# Patient Record
Sex: Female | Born: 1945
Health system: Southern US, Community
[De-identification: ages and names within clinical notes are randomized; demographics above are authoritative.]

## PROBLEM LIST (undated history)

## (undated) DIAGNOSIS — F419 Anxiety disorder, unspecified: Secondary | ICD-10-CM

## (undated) DIAGNOSIS — F329 Major depressive disorder, single episode, unspecified: Secondary | ICD-10-CM

## (undated) DIAGNOSIS — E785 Hyperlipidemia, unspecified: Secondary | ICD-10-CM

## (undated) DIAGNOSIS — H269 Unspecified cataract: Secondary | ICD-10-CM

## (undated) DIAGNOSIS — C801 Malignant (primary) neoplasm, unspecified: Secondary | ICD-10-CM

## (undated) DIAGNOSIS — R011 Cardiac murmur, unspecified: Secondary | ICD-10-CM

## (undated) DIAGNOSIS — K5730 Diverticulosis of large intestine without perforation or abscess without bleeding: Secondary | ICD-10-CM

## (undated) DIAGNOSIS — T7840XA Allergy, unspecified, initial encounter: Secondary | ICD-10-CM

## (undated) DIAGNOSIS — K635 Polyp of colon: Secondary | ICD-10-CM

## (undated) DIAGNOSIS — Z85828 Personal history of other malignant neoplasm of skin: Secondary | ICD-10-CM

## (undated) DIAGNOSIS — M1990 Unspecified osteoarthritis, unspecified site: Secondary | ICD-10-CM

## (undated) DIAGNOSIS — D039 Melanoma in situ, unspecified: Secondary | ICD-10-CM

## (undated) DIAGNOSIS — K219 Gastro-esophageal reflux disease without esophagitis: Secondary | ICD-10-CM

## (undated) DIAGNOSIS — J984 Other disorders of lung: Secondary | ICD-10-CM

## (undated) DIAGNOSIS — M8580 Other specified disorders of bone density and structure, unspecified site: Secondary | ICD-10-CM

## (undated) DIAGNOSIS — N951 Menopausal and female climacteric states: Secondary | ICD-10-CM

## (undated) DIAGNOSIS — R918 Other nonspecific abnormal finding of lung field: Secondary | ICD-10-CM

## (undated) DIAGNOSIS — D235 Other benign neoplasm of skin of trunk: Secondary | ICD-10-CM

## (undated) HISTORY — DX: Gastro-esophageal reflux disease without esophagitis: K21.9

## (undated) HISTORY — DX: Unspecified cataract: H26.9

## (undated) HISTORY — DX: Anxiety disorder, unspecified: F41.9

## (undated) HISTORY — PX: TONSILLECTOMY: SHX5217

## (undated) HISTORY — DX: Other benign neoplasm of skin of trunk: D23.5

## (undated) HISTORY — DX: Menopausal and female climacteric states: N95.1

## (undated) HISTORY — DX: Allergy, unspecified, initial encounter: T78.40XA

## (undated) HISTORY — DX: Melanoma in situ, unspecified: D03.9

## (undated) HISTORY — DX: Diverticulosis of large intestine without perforation or abscess without bleeding: K57.30

## (undated) HISTORY — DX: Polyp of colon: K63.5

## (undated) HISTORY — DX: Major depressive disorder, single episode, unspecified: F32.9

## (undated) HISTORY — DX: Cardiac murmur, unspecified: R01.1

## (undated) HISTORY — PX: COLONOSCOPY: SHX174

## (undated) HISTORY — DX: Malignant (primary) neoplasm, unspecified: C80.1

## (undated) HISTORY — DX: Other disorders of lung: J98.4

## (undated) HISTORY — DX: Other specified disorders of bone density and structure, unspecified site: M85.80

## (undated) HISTORY — DX: Personal history of other malignant neoplasm of skin: Z85.828

## (undated) HISTORY — DX: Hyperlipidemia, unspecified: E78.5

## (undated) HISTORY — PX: BUNIONECTOMY: SHX129

## (undated) HISTORY — DX: Unspecified osteoarthritis, unspecified site: M19.90

## (undated) HISTORY — DX: Other nonspecific abnormal finding of lung field: R91.8

---

## 1998-12-22 ENCOUNTER — Other Ambulatory Visit: Admission: RE | Admit: 1998-12-22 | Discharge: 1998-12-22 | Payer: Self-pay | Admitting: *Deleted

## 1999-06-10 ENCOUNTER — Other Ambulatory Visit: Admission: RE | Admit: 1999-06-10 | Discharge: 1999-06-10 | Payer: Self-pay | Admitting: *Deleted

## 1999-06-10 ENCOUNTER — Encounter (INDEPENDENT_AMBULATORY_CARE_PROVIDER_SITE_OTHER): Payer: Self-pay | Admitting: Specialist

## 1999-12-27 ENCOUNTER — Other Ambulatory Visit: Admission: RE | Admit: 1999-12-27 | Discharge: 1999-12-27 | Payer: Self-pay | Admitting: *Deleted

## 2000-06-05 HISTORY — PX: DILATION AND CURETTAGE OF UTERUS: SHX78

## 2001-01-07 ENCOUNTER — Other Ambulatory Visit: Admission: RE | Admit: 2001-01-07 | Discharge: 2001-01-07 | Payer: Self-pay | Admitting: *Deleted

## 2002-01-16 ENCOUNTER — Other Ambulatory Visit: Admission: RE | Admit: 2002-01-16 | Discharge: 2002-01-16 | Payer: Self-pay | Admitting: *Deleted

## 2002-10-14 LAB — HM COLONOSCOPY: HM Colonoscopy: NORMAL

## 2003-02-17 ENCOUNTER — Other Ambulatory Visit: Admission: RE | Admit: 2003-02-17 | Discharge: 2003-02-17 | Payer: Self-pay | Admitting: *Deleted

## 2004-04-13 ENCOUNTER — Other Ambulatory Visit: Admission: RE | Admit: 2004-04-13 | Discharge: 2004-04-13 | Payer: Self-pay | Admitting: *Deleted

## 2005-04-24 ENCOUNTER — Other Ambulatory Visit: Admission: RE | Admit: 2005-04-24 | Discharge: 2005-04-24 | Payer: Self-pay | Admitting: *Deleted

## 2006-04-11 ENCOUNTER — Other Ambulatory Visit: Admission: RE | Admit: 2006-04-11 | Discharge: 2006-04-11 | Payer: Self-pay | Admitting: *Deleted

## 2007-02-04 DIAGNOSIS — D039 Melanoma in situ, unspecified: Secondary | ICD-10-CM

## 2007-02-04 DIAGNOSIS — R918 Other nonspecific abnormal finding of lung field: Secondary | ICD-10-CM

## 2007-02-04 HISTORY — DX: Other nonspecific abnormal finding of lung field: R91.8

## 2007-02-04 HISTORY — DX: Melanoma in situ, unspecified: D03.9

## 2007-02-20 ENCOUNTER — Ambulatory Visit (HOSPITAL_COMMUNITY): Admission: RE | Admit: 2007-02-20 | Discharge: 2007-02-20 | Payer: Self-pay | Admitting: Dermatology

## 2007-03-25 ENCOUNTER — Ambulatory Visit: Payer: Self-pay | Admitting: Internal Medicine

## 2007-03-25 DIAGNOSIS — J984 Other disorders of lung: Secondary | ICD-10-CM

## 2007-03-25 DIAGNOSIS — N951 Menopausal and female climacteric states: Secondary | ICD-10-CM

## 2007-03-25 HISTORY — DX: Other disorders of lung: J98.4

## 2007-03-25 HISTORY — DX: Menopausal and female climacteric states: N95.1

## 2007-03-26 ENCOUNTER — Encounter: Payer: Self-pay | Admitting: Internal Medicine

## 2007-03-26 DIAGNOSIS — Z85828 Personal history of other malignant neoplasm of skin: Secondary | ICD-10-CM

## 2007-03-26 DIAGNOSIS — K5730 Diverticulosis of large intestine without perforation or abscess without bleeding: Secondary | ICD-10-CM

## 2007-03-26 HISTORY — DX: Personal history of other malignant neoplasm of skin: Z85.828

## 2007-03-26 HISTORY — DX: Diverticulosis of large intestine without perforation or abscess without bleeding: K57.30

## 2007-04-04 ENCOUNTER — Ambulatory Visit: Payer: Self-pay | Admitting: Internal Medicine

## 2007-04-04 LAB — CONVERTED CEMR LAB
ALT: 21 units/L (ref 0–35)
AST: 25 units/L (ref 0–37)
Alkaline Phosphatase: 47 units/L (ref 39–117)
BUN: 11 mg/dL (ref 6–23)
Basophils Relative: 0.4 % (ref 0.0–1.0)
Bilirubin, Direct: 0.1 mg/dL (ref 0.0–0.3)
CO2: 29 meq/L (ref 19–32)
Calcium: 10 mg/dL (ref 8.4–10.5)
Chloride: 106 meq/L (ref 96–112)
Creatinine, Ser: 0.7 mg/dL (ref 0.4–1.2)
Crystals: NEGATIVE
Eosinophils Absolute: 0.1 10*3/uL (ref 0.0–0.6)
Eosinophils Relative: 1.6 % (ref 0.0–5.0)
Glucose, Bld: 89 mg/dL (ref 70–99)
HCT: 42.4 % (ref 36.0–46.0)
Ketones, ur: NEGATIVE mg/dL
Lymphocytes Relative: 30.8 % (ref 12.0–46.0)
MCV: 92 fL (ref 78.0–100.0)
Mucus, UA: NEGATIVE
Neutro Abs: 3.9 10*3/uL (ref 1.4–7.7)
Neutrophils Relative %: 61.6 % (ref 43.0–77.0)
Total Bilirubin: 0.7 mg/dL (ref 0.3–1.2)
Total Protein, Urine: NEGATIVE mg/dL
Total Protein: 7.5 g/dL (ref 6.0–8.3)
VLDL: 25 mg/dL (ref 0–40)
Vit D, 1,25-Dihydroxy: 28 — ABNORMAL LOW (ref 30–89)
WBC: 6.3 10*3/uL (ref 4.5–10.5)

## 2007-04-30 ENCOUNTER — Other Ambulatory Visit: Admission: RE | Admit: 2007-04-30 | Discharge: 2007-04-30 | Payer: Self-pay | Admitting: *Deleted

## 2007-05-23 ENCOUNTER — Telehealth: Payer: Self-pay | Admitting: Internal Medicine

## 2008-03-16 ENCOUNTER — Encounter: Payer: Self-pay | Admitting: Internal Medicine

## 2008-04-27 ENCOUNTER — Encounter: Payer: Self-pay | Admitting: Internal Medicine

## 2008-05-01 ENCOUNTER — Encounter: Payer: Self-pay | Admitting: Internal Medicine

## 2008-05-04 ENCOUNTER — Other Ambulatory Visit: Admission: RE | Admit: 2008-05-04 | Discharge: 2008-05-04 | Payer: Self-pay | Admitting: Gynecology

## 2009-03-08 ENCOUNTER — Encounter: Payer: Self-pay | Admitting: Internal Medicine

## 2009-03-24 ENCOUNTER — Encounter: Payer: Self-pay | Admitting: Internal Medicine

## 2009-07-01 ENCOUNTER — Encounter: Payer: Self-pay | Admitting: Internal Medicine

## 2009-07-02 ENCOUNTER — Encounter: Payer: Self-pay | Admitting: Internal Medicine

## 2009-07-06 ENCOUNTER — Encounter: Payer: Self-pay | Admitting: Internal Medicine

## 2010-01-10 ENCOUNTER — Encounter: Payer: Self-pay | Admitting: Internal Medicine

## 2010-04-04 ENCOUNTER — Encounter: Payer: Self-pay | Admitting: Internal Medicine

## 2010-07-05 NOTE — Miscellaneous (Signed)
Summary: Mammogram  Clinical Lists Changes  Observations: Added new observation of MAMMOGRAM: normal (07/01/2009 8:45)      Preventive Care Screening  Mammogram:    Date:  07/01/2009    Results:  normal

## 2010-07-12 ENCOUNTER — Encounter: Payer: Self-pay | Admitting: Internal Medicine

## 2010-07-21 NOTE — Miscellaneous (Signed)
Summary: mammogram  Clinical Lists Changes  Observations: Added new observation of MAMMOGRAM: normal (07/12/2010 14:58)      Preventive Care Screening  Mammogram:    Date:  07/12/2010    Results:  normal

## 2010-09-14 ENCOUNTER — Encounter: Payer: Self-pay | Admitting: Family

## 2010-09-14 ENCOUNTER — Ambulatory Visit (INDEPENDENT_AMBULATORY_CARE_PROVIDER_SITE_OTHER): Payer: 59 | Admitting: Family

## 2010-09-14 VITALS — BP 110/80 | HR 70 | Temp 98.0°F | Resp 18 | Wt 139.0 lb

## 2010-09-14 DIAGNOSIS — W57XXXA Bitten or stung by nonvenomous insect and other nonvenomous arthropods, initial encounter: Secondary | ICD-10-CM

## 2010-09-14 MED ORDER — DOXYCYCLINE HYCLATE 100 MG PO TABS: 100.0000 mg | ORAL_TABLET | Freq: Two times a day (BID) | ORAL | Status: AC

## 2010-09-14 NOTE — Assessment & Plan Note (Signed)
+   mild erythema of right thigh bite- likely sensitivity rxn.  No clear "bulls eye" lesion or  Will plan empiric treatment with doxycycline to help treat possible cellulitis and empirically treat for Lyme.

## 2010-09-14 NOTE — Patient Instructions (Signed)
Please schedule a complete physical with Dr. Artist Pais.  Come fasting to this appointment. Call if you develop fever, rash, or joint pain.

## 2010-09-14 NOTE — Progress Notes (Signed)
  Subjective:    Patient ID: Samantha Landry, female    DOB: 1945/09/01, 65 y.o.   MRN: [id number]  HPI  Samantha Landry is a 65 year old female who presents following tick bites. She was doing yard work on Thursday.  Friday found 5 small deer ticks on her legs. She found an additional 2 bites on Friday.  Denies rashes.  Notes some itching at the area on her thigh.  Husband removed the ticks.   Review of Systems  Musculoskeletal: Negative for joint swelling and arthralgias.  Skin: Negative for rash.  Neurological: Positive for light-headedness.   Past Medical History  Diagnosis Date  . Melanoma in situ 02/2007    right arm  . Pulmonary nodules 02/2007    2-49mm  . Diverticulosis of colon     History   Social History  . Marital Status: Married    Spouse Name: N/A    Number of Children: 2  . Years of Education: N/A   Occupational History  . retired    Social History Main Topics  . Smoking status: Never Smoker   . Smokeless tobacco: Never Used  . Alcohol Use: Yes  . Drug Use: Not on file  . Sexually Active: Not on file   Other Topics Concern  . 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 Tonsillectomy   . Dilation and curettage of uterus 2002    uterine fibroids    Family History  Problem Relation Age of Onset  . Cancer Father     lung    Allergies  Allergen Reactions  . Codeine     REACTION: Jittery    No current outpatient prescriptions on file prior to visit.    BP 110/80  Pulse 70  Temp(Src) 98 F (36.7 C) (Oral)  Resp 18  Wt 139 lb (63.05 kg)  SpO2 99%        Objective:   Physical Exam  Constitutional: She appears well-developed and well-nourished.  Cardiovascular: Normal rate and regular rhythm.   Pulmonary/Chest: Effort normal and breath sounds normal.  Skin:       Several bites on left  inner thigh with surrounding erythema.  Small bite right groin, left shin and between 4th and 5th toes on right.            Assessment & Plan:

## 2010-10-21 ENCOUNTER — Encounter: Payer: Self-pay | Admitting: Internal Medicine

## 2010-10-26 ENCOUNTER — Ambulatory Visit (INDEPENDENT_AMBULATORY_CARE_PROVIDER_SITE_OTHER): Payer: 59 | Admitting: Internal Medicine

## 2010-10-26 ENCOUNTER — Encounter: Payer: Self-pay | Admitting: Internal Medicine

## 2010-10-26 VITALS — BP 98/60 | HR 69 | Temp 97.7°F | Resp 20 | Ht 63.75 in | Wt 136.0 lb

## 2010-10-26 DIAGNOSIS — Z136 Encounter for screening for cardiovascular disorders: Secondary | ICD-10-CM

## 2010-10-26 DIAGNOSIS — J984 Other disorders of lung: Secondary | ICD-10-CM

## 2010-10-26 DIAGNOSIS — Z23 Encounter for immunization: Secondary | ICD-10-CM

## 2010-10-26 DIAGNOSIS — Z0000 Encounter for general adult medical examination without abnormal findings: Secondary | ICD-10-CM

## 2010-10-26 MED ORDER — TETANUS-DIPHTH-ACELL PERTUSSIS 5-2.5-18.5 LF-MCG/0.5 IM SUSP
0.5000 mL | Freq: Once | INTRAMUSCULAR | Status: AC
  Administered 2010-10-26: 0.5 mL via INTRAMUSCULAR

## 2010-10-26 MED ORDER — ZOSTER VACCINE LIVE 19400 UNT/0.65ML ~~LOC~~ SOLR: 0.6500 mL | Freq: Once | SUBCUTANEOUS | Status: DC

## 2010-10-26 NOTE — Progress Notes (Signed)
  Subjective:    Patient ID: Samantha Landry, female    DOB: 1946/05/20, 65 y.o.   MRN: [id number]  HPI  Followed by Dr. Maisie Fus (Derm) in [place]. No chest symptoms - no cough, no chest pain  Daughter is PA for - Shelda Altes  - works with Dr. Shelle Iron  Review of Systems     Past Medical History  Diagnosis Date  . Melanoma in situ 02/2007    right arm  . Pulmonary nodules 02/2007    2-72mm  . Diverticulosis of colon   . Heart murmur     History   Social History  . Marital Status: Married    Spouse Name: N/A    Number of Children: 2  . Years of Education: N/A   Occupational History  . retired     Public librarian   Social History Main Topics  . Smoking status: Never Smoker   . Smokeless tobacco: Never Used  . Alcohol Use: Yes  . Drug Use: Not on file  . Sexually Active: Not on file   Other Topics Concern  . 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 Tonsillectomy   . Dilation and curettage of uterus 2002    uterine fibroids    Family History  Problem Relation Age of Onset  . Cancer Father     lung    Allergies  Allergen Reactions  . Codeine     REACTION: Jittery    Current Outpatient Prescriptions on File Prior to Visit  Medication Sig Dispense Refill  . aspirin 81 MG tablet Take 81 mg by mouth daily.        Marland Kitchen estradiol-norethindrone (ACTIVELLA) 1-0.5 MG per tablet Take 1 tablet by mouth daily.        . fish oil-omega-3 fatty acids 1000 MG capsule Take 1 capsule by mouth daily.        . Multiple Vitamin (MULTIVITAMIN) tablet Take 1 tablet by mouth daily.          BP 98/60  Pulse 69  Temp(Src) 97.7 F (36.5 C) (Oral)  Resp 20  Ht 5' 3.75" (1.619 m)  Wt 136 lb (61.689 kg)  BMI 23.53 kg/m2  SpO2 98%    Objective:   Physical Exam        Assessment & Plan:

## 2010-10-26 NOTE — Patient Instructions (Signed)
Please follow low saturated fat diet - see handout Return in 3 months for repeat blood test  FLP,  TSH, Free T4, C-Reactive protein -  272.4

## 2010-11-01 ENCOUNTER — Ambulatory Visit (HOSPITAL_BASED_OUTPATIENT_CLINIC_OR_DEPARTMENT_OTHER)
Admission: RE | Admit: 2010-11-01 | Discharge: 2010-11-01 | Disposition: A | Discharge status: Home / Self Care | Payer: 59 | Source: Ambulatory Visit | Attending: Internal Medicine | Admitting: Internal Medicine

## 2010-11-01 DIAGNOSIS — J984 Other disorders of lung: Secondary | ICD-10-CM

## 2010-11-01 DIAGNOSIS — C439 Malignant melanoma of skin, unspecified: Secondary | ICD-10-CM

## 2010-11-14 DIAGNOSIS — D235 Other benign neoplasm of skin of trunk: Secondary | ICD-10-CM

## 2010-11-14 HISTORY — DX: Other benign neoplasm of skin of trunk: D23.5

## 2011-01-30 ENCOUNTER — Ambulatory Visit (INDEPENDENT_AMBULATORY_CARE_PROVIDER_SITE_OTHER): Payer: 59 | Admitting: Internal Medicine

## 2011-01-30 ENCOUNTER — Encounter: Payer: Self-pay | Admitting: Internal Medicine

## 2011-01-30 ENCOUNTER — Ambulatory Visit: Payer: 59 | Admitting: Internal Medicine

## 2011-01-30 DIAGNOSIS — E7800 Pure hypercholesterolemia, unspecified: Secondary | ICD-10-CM

## 2011-01-30 DIAGNOSIS — E785 Hyperlipidemia, unspecified: Secondary | ICD-10-CM | POA: Insufficient documentation

## 2011-01-30 DIAGNOSIS — C439 Malignant melanoma of skin, unspecified: Secondary | ICD-10-CM

## 2011-01-30 DIAGNOSIS — Z85828 Personal history of other malignant neoplasm of skin: Secondary | ICD-10-CM

## 2011-01-30 DIAGNOSIS — R5383 Other fatigue: Secondary | ICD-10-CM | POA: Insufficient documentation

## 2011-01-30 LAB — LIPID PANEL
Cholesterol: 211 mg/dL — ABNORMAL HIGH (ref 0–200)
HDL: 51 mg/dL (ref >39)
Total CHOL/HDL Ratio: 4.1 Ratio
Triglycerides: 120 mg/dL (ref <150)
VLDL: 24 mg/dL (ref 0–40)

## 2011-01-30 LAB — HEPATIC FUNCTION PANEL
ALT: 15 U/L (ref 0–35)
AST: 25 U/L (ref 0–37)
Bilirubin, Direct: 0.1 mg/dL (ref 0.0–0.3)
Total Protein: 7.3 g/dL (ref 6.0–8.3)

## 2011-01-30 LAB — BASIC METABOLIC PANEL
Calcium: 10.2 mg/dL (ref 8.4–10.5)
Chloride: 108 mEq/L (ref 96–112)
Creat: 0.78 mg/dL (ref 0.50–1.10)
Sodium: 140 mEq/L (ref 135–145)

## 2011-01-30 LAB — CBC
MCV: 90.8 fL (ref 78.0–100.0)
Platelets: 230 10*3/uL (ref 150–400)
RDW: 13.2 % (ref 11.5–15.5)
WBC: 5.9 10*3/uL (ref 4.0–10.5)

## 2011-01-30 NOTE — Assessment & Plan Note (Signed)
Continued close surveillance by [facility] derm. Reports no new lesions or changes.

## 2011-01-30 NOTE — Progress Notes (Signed)
  Subjective:    Patient ID: Rafael Bihari, female    DOB: 12-Mar-1946, 65 y.o.   MRN: [id number]  HPI Pt presents to clinic for followup of multiple medical problems. H/o mild hyperlipidemia that has not required medication to date. Exercises almost daily and has modified diet. H/o melanoma s/p resection followed by [facility] dermatology now on a 6 month basis. No recent labwork. +mild intermittent fatigue. Sees gyn and utd. No active complaints.  Past Medical History  Diagnosis Date  . Melanoma in situ 02/2007    right arm  . Pulmonary nodules 02/2007    2-45mm  . Diverticulosis of colon   . Heart murmur    Past Surgical History  Procedure Date  . Tonsillectomy   . Dilation and curettage of uterus 2002    uterine fibroids    reports that she has never smoked. She has never used smokeless tobacco. She reports that she drinks alcohol. Her drug history not on file. family history includes Cancer in her father. Allergies  Allergen Reactions  . Codeine     REACTION: Jittery     Review of Systems see hpi    Objective:   Physical Exam  Physical Exam  Nursing note and vitals reviewed. Constitutional: Appears well-developed and well-nourished. No distress.  HENT: op clear. Head: Normocephalic and atraumatic.  Right Ear: External ear normal.  Left Ear: External ear normal.  Eyes: Conjunctivae are normal. No scleral icterus.  Neck: Neck supple. Carotid bruit is not present.  Cardiovascular: Normal rate, regular rhythm and normal heart sounds.  Exam reveals no gallop and no friction rub.   No murmur heard. Pulmonary/Chest: Effort normal and breath sounds normal. No respiratory distress. He has no wheezes. no rales.  Lymphadenopathy:    He has no cervical adenopathy.  Neurological:Alert.  Skin: Skin is warm and dry. Not diaphoretic.  Psychiatric: Has a normal mood and affect.        Assessment & Plan:

## 2011-01-30 NOTE — Assessment & Plan Note (Signed)
Obtain lipid/lft. 

## 2011-01-30 NOTE — Assessment & Plan Note (Signed)
Mild. Obtain cbc, chem7, lft

## 2011-02-16 ENCOUNTER — Encounter: Payer: Self-pay | Admitting: Gastroenterology

## 2011-08-01 ENCOUNTER — Ambulatory Visit: Payer: 59 | Admitting: Internal Medicine

## 2011-08-03 ENCOUNTER — Ambulatory Visit (INDEPENDENT_AMBULATORY_CARE_PROVIDER_SITE_OTHER): Payer: 59 | Admitting: Internal Medicine

## 2011-08-03 ENCOUNTER — Encounter: Payer: Self-pay | Admitting: Internal Medicine

## 2011-08-03 DIAGNOSIS — L659 Nonscarring hair loss, unspecified: Secondary | ICD-10-CM | POA: Insufficient documentation

## 2011-08-03 DIAGNOSIS — E785 Hyperlipidemia, unspecified: Secondary | ICD-10-CM

## 2011-08-03 LAB — LIPID PANEL
Cholesterol: 206 mg/dL — ABNORMAL HIGH (ref 0–200)
HDL: 47 mg/dL (ref >39)
Triglycerides: 162 mg/dL — ABNORMAL HIGH (ref <150)

## 2011-08-03 LAB — HM MAMMOGRAPHY

## 2011-08-03 NOTE — Progress Notes (Signed)
  Subjective:    Patient ID: Samantha Landry, female    DOB: 1945/12/03, 66 y.o.   MRN: [id number]  HPI Pt presents to clinic for followup of multiple medical problems. Notes alopecia since 10/12 described as generally thinning of hair without focal area loss or scalp scarring. H/o mild hyperlipidemia not currently requiring medication. utd with gyn care and has mammogram scheduled today. Continues to follow with [facility] dermatology q year for h/o melanoma.  Past Medical History  Diagnosis Date  . Melanoma in situ 02/2007    right arm  . Pulmonary nodules 02/2007    2-39mm  . Diverticulosis of colon   . Heart murmur    Past Surgical History  Procedure Date  . Tonsillectomy   . Dilation and curettage of uterus 2002    uterine fibroids    reports that she has never smoked. She has never used smokeless tobacco. She reports that she drinks alcohol. Her drug history not on file. family history includes Cancer in her father. Allergies  Allergen Reactions  . Codeine     REACTION: Jittery     Review of Systems see hpi     Objective:   Physical Exam  Physical Exam  Nursing note and vitals reviewed. Constitutional: Appears well-developed and well-nourished. No distress.  HENT:  Head: Normocephalic and atraumatic.  Right Ear: External ear normal.  Left Ear: External ear normal.  Eyes: Conjunctivae are normal. No scleral icterus.  Neck: Neck supple. Carotid bruit is not present.  Cardiovascular: Normal rate, regular rhythm and normal heart sounds.  Exam reveals no gallop and no friction rub.   No murmur heard. Pulmonary/Chest: Effort normal and breath sounds normal. No respiratory distress. He has no wheezes. no rales.  Lymphadenopathy:    He has no cervical adenopathy.  Neurological:Alert.  Skin: Skin is warm and dry. Not diaphoretic. no focal hair loss. No scalp scarring. Psychiatric: Has a normal mood and affect.        Assessment & Plan:

## 2011-08-03 NOTE — Assessment & Plan Note (Signed)
Obtain tsh and free t4 

## 2011-08-03 NOTE — Patient Instructions (Signed)
Your cholesterol might be improved by oats/oatmeal or red yeast extract

## 2011-08-03 NOTE — Assessment & Plan Note (Signed)
Obtain lipid profile. Recommend low fat diet, regular exercise. Consider oats/oatmeal and/or red yeast extract

## 2011-08-04 ENCOUNTER — Encounter: Payer: Self-pay | Admitting: Internal Medicine

## 2011-08-08 ENCOUNTER — Encounter: Payer: Self-pay | Admitting: Internal Medicine

## 2011-08-08 LAB — HM MAMMOGRAPHY

## 2011-08-30 ENCOUNTER — Telehealth: Payer: Self-pay | Admitting: *Deleted

## 2011-08-30 NOTE — Telephone Encounter (Signed)
Call placed to patient at [phone], no answer. A voice message was left for patient to return phone call regarding mammogram.  ( mammogram report advised additional imaging needed on right- need to find out if patient is scheduled for follow up )

## 2011-09-04 NOTE — Telephone Encounter (Signed)
Patient returned phone call and left voice message stating she has already had her follow up mammogram and was informed it was fine.

## 2011-10-05 IMAGING — CR DG CHEST 2V
2 series · 2 of 2 positions shown · non-contrast
Comparison: 02/20/2007

CLINICAL DATA: Pulmonary nodule.  Melanoma

CHEST - 2 VIEW

[w chest pa]
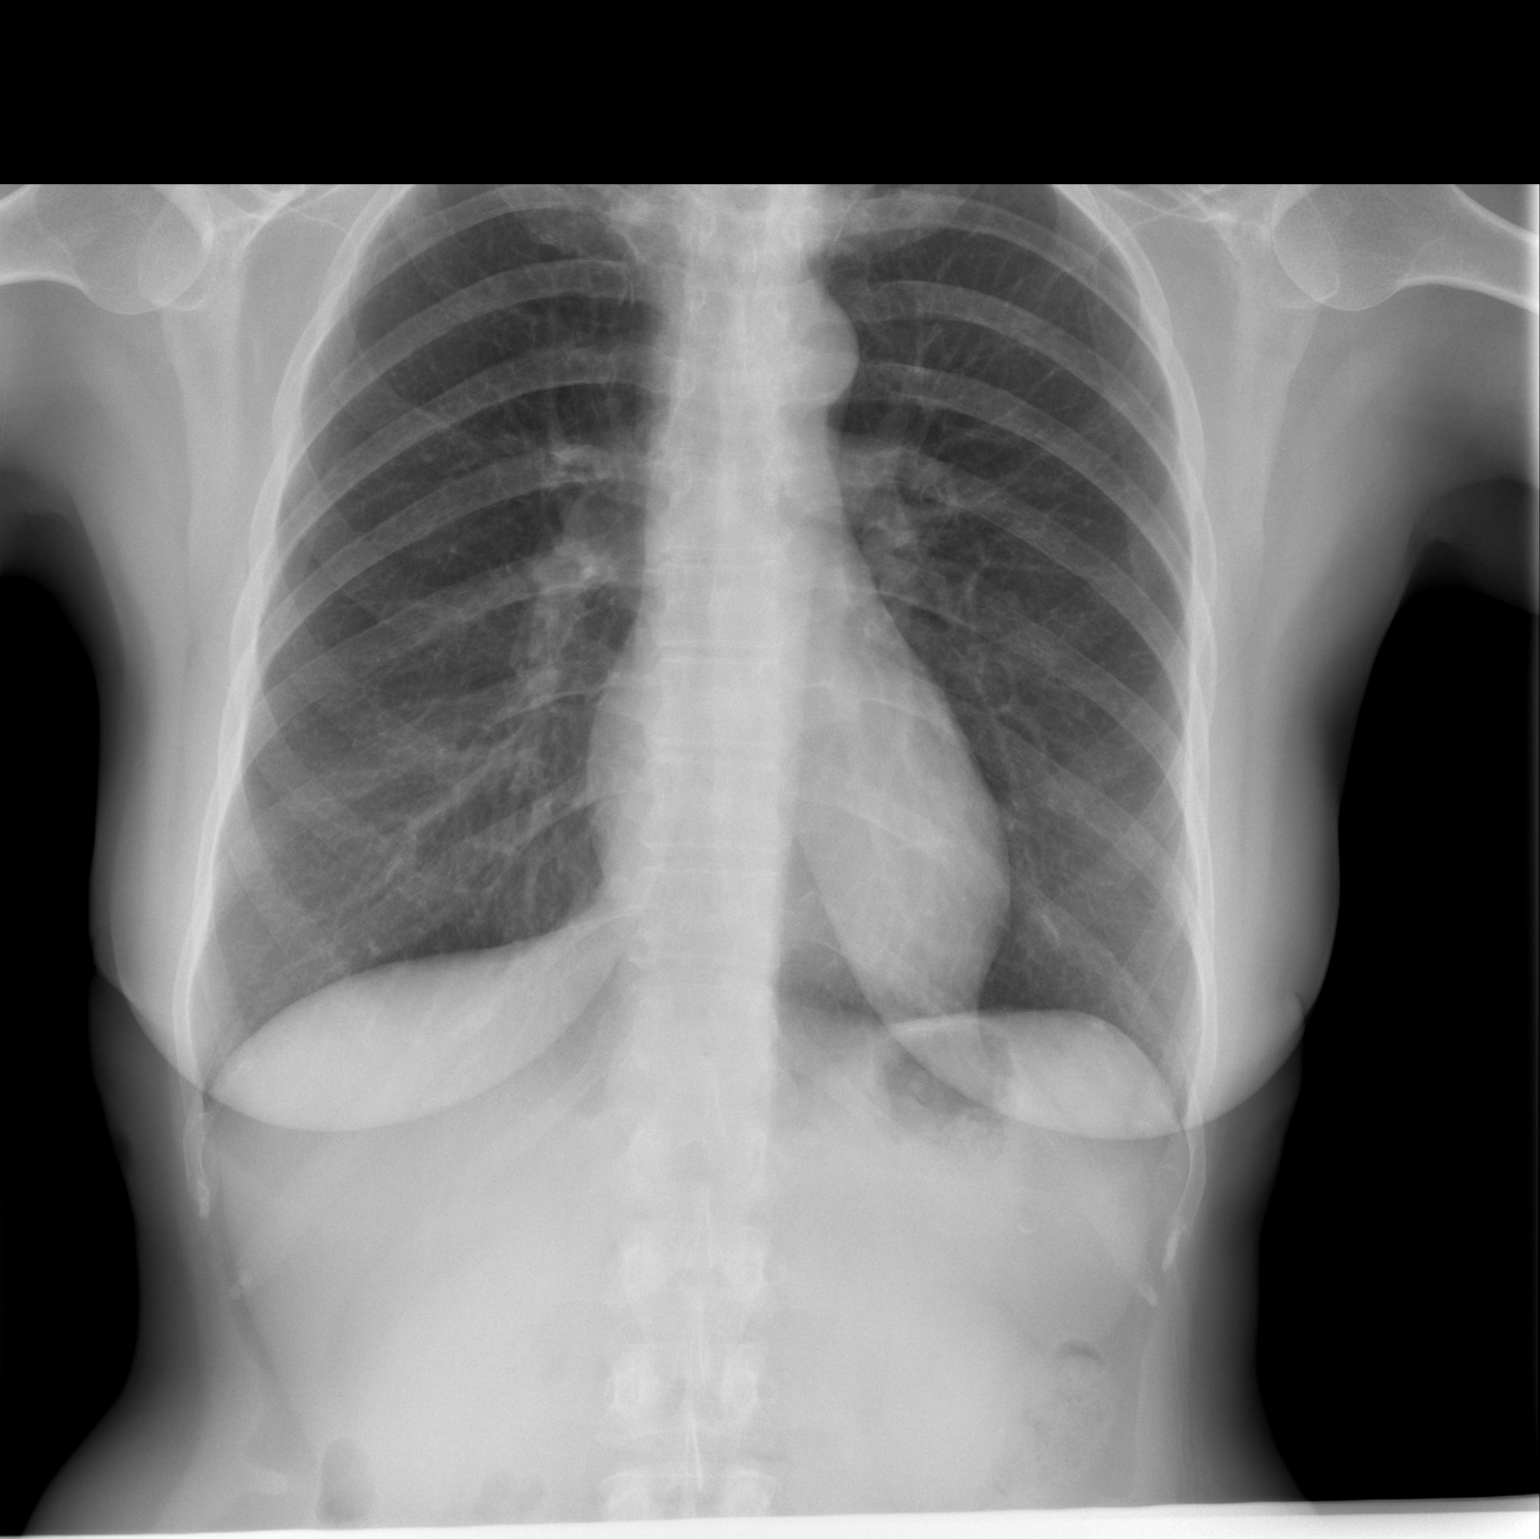

[w chest lat]
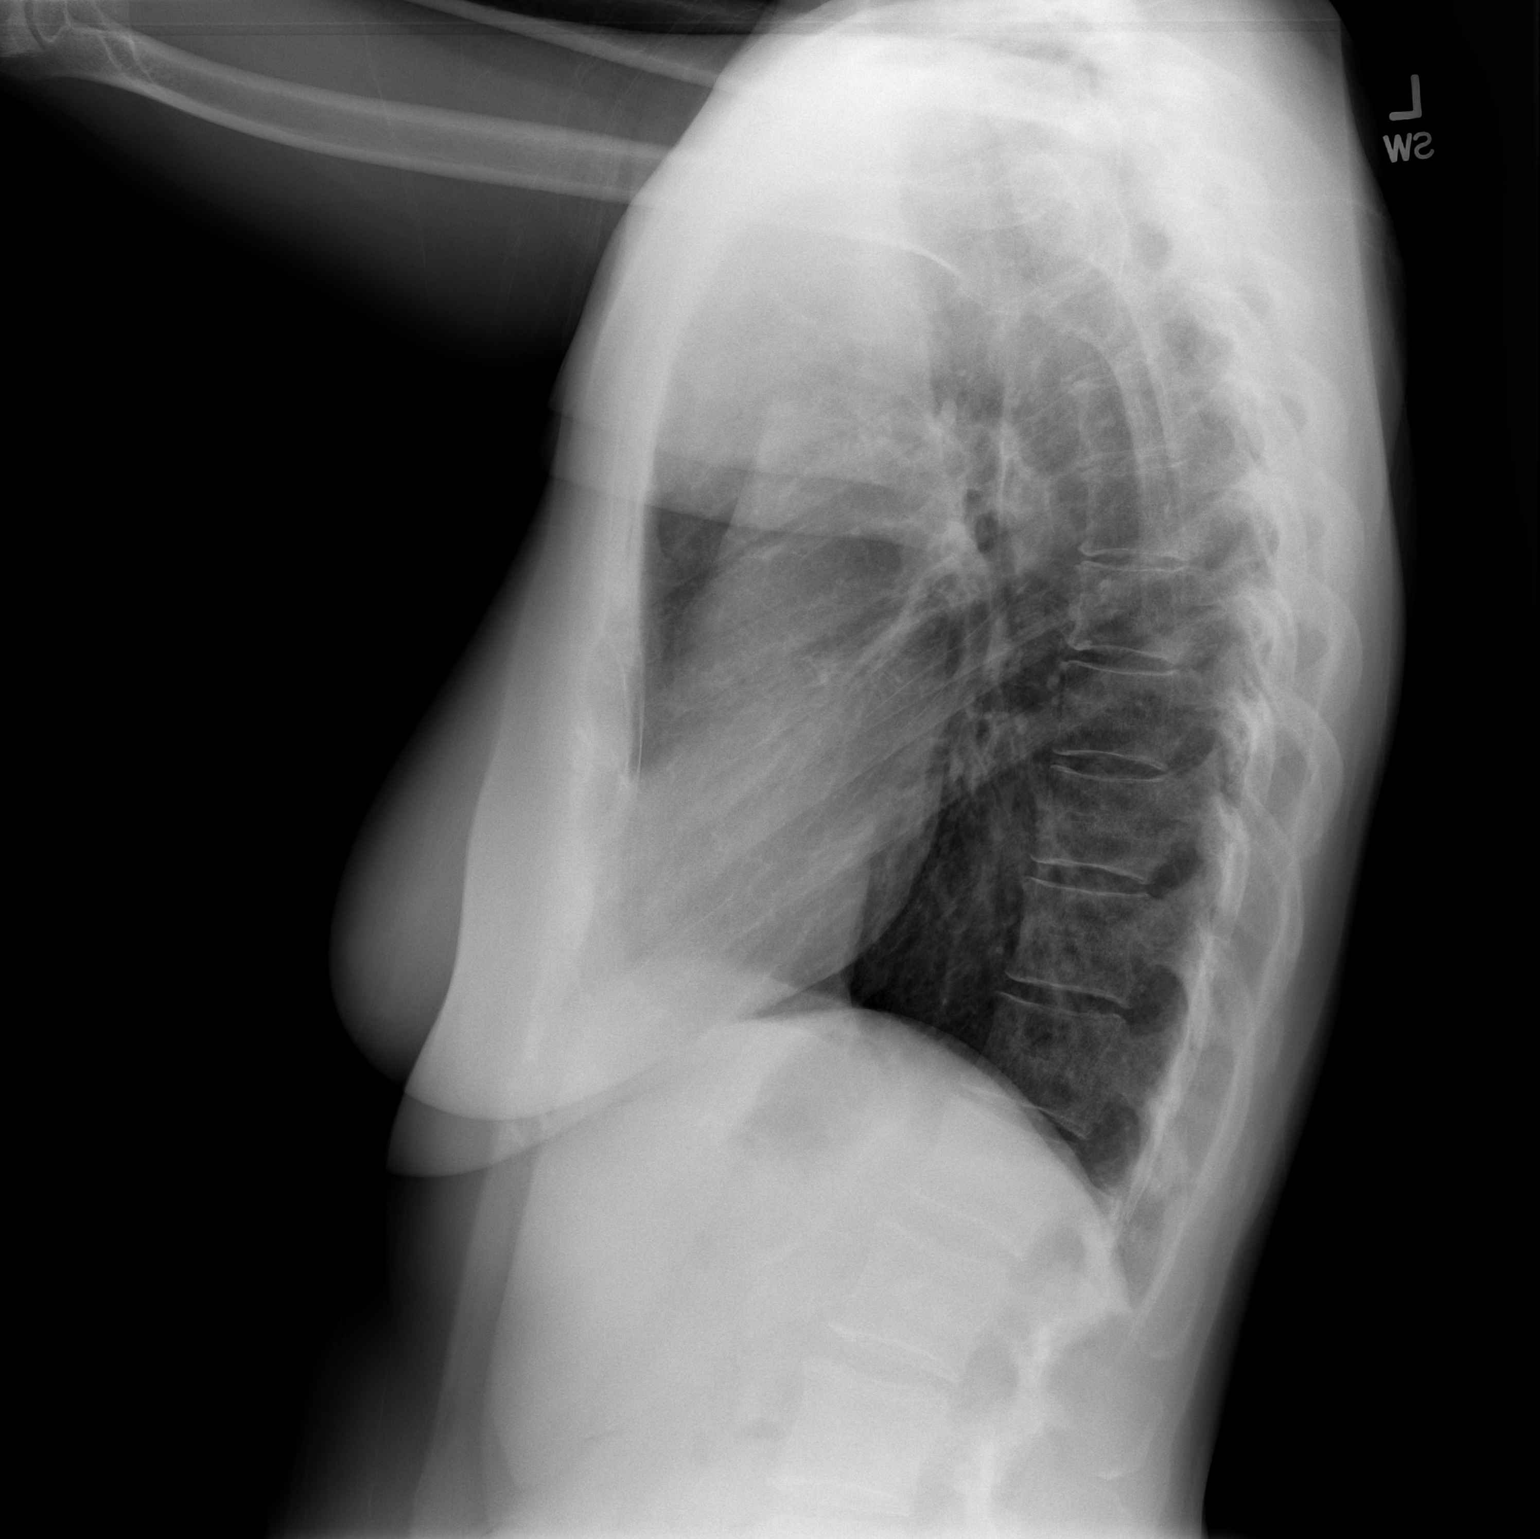

[2 of 2 positions shown; findings below may reference images not displayed]

FINDINGS: Heart size is normal and the vascularity is  normal.
Tiny upper lobe nodules are stable and may be a small granulomata.
Small right lower lobe nodule also stable.  No new mass lesion or
nodule.

Negative for pneumonia or effusion.
IMPRESSION: Stable small nodules bilaterally, likely granulomata.  No acute
abnormality.

## 2011-12-25 DIAGNOSIS — L570 Actinic keratosis: Secondary | ICD-10-CM | POA: Diagnosis not present

## 2011-12-25 DIAGNOSIS — Z85820 Personal history of malignant melanoma of skin: Secondary | ICD-10-CM | POA: Diagnosis not present

## 2012-02-08 DIAGNOSIS — Z23 Encounter for immunization: Secondary | ICD-10-CM | POA: Diagnosis not present

## 2012-02-29 ENCOUNTER — Encounter: Payer: Self-pay | Admitting: Gastroenterology

## 2012-03-15 ENCOUNTER — Encounter: Payer: Self-pay | Admitting: Gastroenterology

## 2012-04-09 ENCOUNTER — Ambulatory Visit (AMBULATORY_SURGERY_CENTER): Payer: Medicare Other | Admitting: *Deleted

## 2012-04-09 VITALS — Ht 64.0 in | Wt 140.0 lb

## 2012-04-09 DIAGNOSIS — Z1211 Encounter for screening for malignant neoplasm of colon: Secondary | ICD-10-CM

## 2012-04-09 MED ORDER — NA SULFATE-K SULFATE-MG SULF 17.5-3.13-1.6 GM/177ML PO SOLN: ORAL | Status: DC

## 2012-04-23 ENCOUNTER — Ambulatory Visit (AMBULATORY_SURGERY_CENTER): Payer: Medicare Other | Admitting: Gastroenterology

## 2012-04-23 ENCOUNTER — Encounter: Payer: Self-pay | Admitting: Gastroenterology

## 2012-04-23 VITALS — BP 119/53 | HR 63 | Temp 96.9°F | Resp 17 | Ht 64.0 in | Wt 140.0 lb

## 2012-04-23 DIAGNOSIS — Z1211 Encounter for screening for malignant neoplasm of colon: Secondary | ICD-10-CM

## 2012-04-23 DIAGNOSIS — K648 Other hemorrhoids: Secondary | ICD-10-CM

## 2012-04-23 DIAGNOSIS — D126 Benign neoplasm of colon, unspecified: Secondary | ICD-10-CM | POA: Diagnosis not present

## 2012-04-23 MED ORDER — SODIUM CHLORIDE 0.9 % IV SOLN: 500.0000 mL | INTRAVENOUS | Status: DC

## 2012-04-23 NOTE — Progress Notes (Signed)
No egg or soy allergy per pt. ewm 

## 2012-04-23 NOTE — Progress Notes (Signed)
Patient did not experience any of the following events: a burn prior to discharge; a fall within the facility; wrong site/side/patient/procedure/implant event; or a hospital transfer or hospital admission upon discharge from the facility. (G8907) Patient did not have preoperative order for IV antibiotic SSI prophylaxis. (G8918)   Charted by April Mirts RN 

## 2012-04-23 NOTE — Op Note (Signed)
[facility] [address] [place], [address]   COLONOSCOPY PROCEDURE REPORT  PATIENT: Samantha Landry, Samantha Landry  MR#: [id number] BIRTHDATE: 1945-12-06 , 66  yrs. old GENDER: Female ENDOSCOPIST: Louis Meckel, MD REFERRED IO:NGEXBM Rodena Medin, M.D. PROCEDURE DATE:  04/23/2012 PROCEDURE:   Colonoscopy with cold biopsy polypectomy ASA CLASS:   Class II INDICATIONS:average risk screening. MEDICATIONS: MAC sedation, administered by CRNA and propofol (Diprivan) 150mg  IV  DESCRIPTION OF PROCEDURE:   After the risks benefits and alternatives of the procedure were thoroughly explained, informed consent was obtained.  A digital rectal exam revealed no abnormalities of the rectum.   The LB [id number]  endoscope was introduced through the anus and advanced to the cecum, which was identified by both the appendix and ileocecal valve. No adverse events experienced.   The quality of the prep was Suprep adequate The instrument was then slowly withdrawn as the colon was fully examined.      COLON FINDINGS: A sessile polyp was found in the descending colon. A polypectomy was performed with cold forceps.  The resection was complete and the polyp tissue was completely retrieved.   Internal hemorrhoids were found.   The colon mucosa was otherwise normal. Retroflexed views revealed no abnormalities. The time to cecum=4 minutes 14 seconds.  Withdrawal time=8 minutes 46 seconds.  The scope was withdrawn and the procedure completed. COMPLICATIONS: There were no complications.  ENDOSCOPIC IMPRESSION: 1.   Sessile polyp was found in the descending colon; polypectomy was performed with cold forceps 2.   Internal hemorrhoids 3.   The colon mucosa was otherwise normal  RECOMMENDATIONS: If the polyp(s) removed today are proven to be adenomatous (pre-cancerous) polyps, you will need a repeat colonoscopy in 5 years.  Otherwise you should continue to follow colorectal cancer screening  guidelines for "routine risk" patients with colonoscopy in 10 years.  You will receive a letter within 1-2 weeks with the results of your biopsy as well as final recommendations.  Please call my office if you have not received a letter after 3 weeks.   eSigned:  Louis Meckel, MD 04/23/2012 11:32 AM   cc:

## 2012-04-23 NOTE — Patient Instructions (Addendum)

## 2012-04-23 NOTE — Progress Notes (Signed)
Propofol given over incremental dosages 

## 2012-04-24 ENCOUNTER — Telehealth: Payer: Self-pay

## 2012-04-24 NOTE — Telephone Encounter (Signed)
  Follow up Call-  Call back number 04/23/2012  Post procedure Call Back phone  # [phone]  Permission to leave phone message Yes     Patient questions:  Do you have a fever, pain , or abdominal swelling? no Pain Score  0 *  Have you tolerated food without any problems? yes  Have you been able to return to your normal activities? yes  Do you have any questions about your discharge instructions: Diet   no Medications  no Follow up visit  no  Do you have questions or concerns about your Care? no  Actions: * If pain score is 4 or above: No action needed, pain <4.

## 2012-04-30 ENCOUNTER — Encounter: Payer: Self-pay | Admitting: Gastroenterology

## 2012-05-16 DIAGNOSIS — Z124 Encounter for screening for malignant neoplasm of cervix: Secondary | ICD-10-CM | POA: Diagnosis not present

## 2012-08-02 ENCOUNTER — Encounter: Payer: 59 | Admitting: Internal Medicine

## 2012-08-05 ENCOUNTER — Encounter: Payer: 59 | Admitting: Family

## 2012-08-13 DIAGNOSIS — Z13820 Encounter for screening for osteoporosis: Secondary | ICD-10-CM | POA: Diagnosis not present

## 2012-08-13 DIAGNOSIS — Z1231 Encounter for screening mammogram for malignant neoplasm of breast: Secondary | ICD-10-CM | POA: Diagnosis not present

## 2012-08-16 ENCOUNTER — Other Ambulatory Visit: Payer: Self-pay | Admitting: Family

## 2012-08-16 ENCOUNTER — Ambulatory Visit (INDEPENDENT_AMBULATORY_CARE_PROVIDER_SITE_OTHER): Payer: Medicare Other | Admitting: Family

## 2012-08-16 ENCOUNTER — Encounter: Payer: Self-pay | Admitting: Family

## 2012-08-16 VITALS — BP 110/70 | HR 60 | Temp 97.7°F | Resp 16 | Ht 65.0 in | Wt 137.0 lb

## 2012-08-16 DIAGNOSIS — Z0000 Encounter for general adult medical examination without abnormal findings: Secondary | ICD-10-CM

## 2012-08-16 DIAGNOSIS — M949 Disorder of cartilage, unspecified: Secondary | ICD-10-CM | POA: Diagnosis not present

## 2012-08-16 DIAGNOSIS — E785 Hyperlipidemia, unspecified: Secondary | ICD-10-CM

## 2012-08-16 NOTE — Patient Instructions (Addendum)
Please complete your lab work prior to leaving. Follow up in 1 year, sooner if problems/concerns.  

## 2012-08-16 NOTE — Progress Notes (Addendum)
Subjective:    Patient ID: Samantha Landry, female    DOB: Oct 14, 1945, 67 y.o.   MRN: [id number]  HPI  Subjective:   Patient here for Medicare annual wellness visit and management of other chronic and acute problems.  Immunizations: up to date Diet: Reports healthy diet. Exercise:  Enjoys walking 5-6 times week for 30-40 minutes. Colonoscopy: 2013 was told 5 yr follow up. Dexa: up to date  Pap Smear: up to date with GYN (Mezner) Mammogram: up to date.     Pt here for fasting physical. Had colonoscopy in 2013, Just had DEXA and mammogram. Completed pap smear 04/2012 (Dr Erroll Luna). Last EKG 10/26/10.  Risk factors:   Roster of Physicians Providing Medical Care to Patient: Dr. Arlyce Dice- GI  Activities of Daily Living  In your present state of health, do you have any difficulty performing the following activities? Preparing food and eating?: No  Bathing yourself: No  Getting dressed: No  Using the toilet:No  Moving around from place to place: No  In the past year have you fallen or had a near fall?:No    Home Safety: Has smoke detector and wears seat belts. No firearm- out of reach from children. No excess sun exposure.  Diet and Exercise  Current exercise habits: see above Dietary issues discussed: healthy diet   Depression Screen  (Note: if answer to either of the following is "Yes", then a more complete depression screening is indicated)  Q1: Over the past two weeks, have you felt down, depressed or hopeless? no  Q2: Over the past two weeks, have you felt little interest or pleasure in doing things? no   The following portions of the patient's history were reviewed and updated as appropriate: allergies, current medications, past family history, past medical history, past social history, past surgical history and problem list.   Past Medical History  Diagnosis Date  . Melanoma in situ 02/2007    right arm  . Pulmonary nodules 02/2007    2-88mm  . Diverticulosis of colon   .  Heart murmur   . Arthritis     History   Social History  . Marital Status: Married    Spouse Name: N/A    Number of Children: 2  . Years of Education: N/A   Occupational History  . retired     Public librarian   Social History Main Topics  . Smoking status: Never Smoker   . Smokeless tobacco: Never Used  . Alcohol Use: Yes     Comment: less than 1 drink of wine a week  . Drug Use: No  . Sexually Active: Not on file   Other Topics Concern  . Not on file   Social History Narrative   Retired housewife, but worked previously as Public librarian   Daughter is PA @ orthopedic office   Son is a Runner, broadcasting/film/video          Past Surgical History  Procedure Laterality Date  . Tonsillectomy    . Dilation and curettage of uterus  2002    uterine fibroids  . Colonoscopy      Family History  Problem Relation Age of Onset  . Cancer Father     lung  . Colon cancer Cousin     1st pat.cousin  . Rectal cancer Neg Hx   . Stomach cancer Neg Hx     Allergies  Allergen Reactions  . Codeine     REACTION: Jittery    Current Outpatient Prescriptions on  File Prior to Visit  Medication Sig Dispense Refill  . aspirin 81 MG tablet Take 81 mg by mouth daily.        . fish oil-omega-3 fatty acids 1000 MG capsule Take 1 capsule by mouth daily.         No current facility-administered medications on file prior to visit.    BP 110/70  Pulse 60  Temp(Src) 97.7 F (36.5 C) (Oral)  Resp 16  Ht 5\' 5"  (1.651 m)  Wt 137 lb (62.143 kg)  BMI 22.8 kg/m2  SpO2 99%    Objective:   Vision: Hearing:  Body mass index: Cognitive Impairment Assessment: cognition, memory and judgment appear normal.   Assessment:   Medicare wellness utd on preventive parameters  Plan:   During the course of the visit the patient was educated and counseled about appropriate screening and preventive services including:       Fall prevention   Screening mammography  Bone densitometry screening  Diabetes  screening  Nutrition counseling   Vaccines / LABS Zostavax / Pnemonccoal Vaccine  today  PSA  Patient Instructions (the written plan) was given to the patient.       Review of Systems  Constitutional: Negative for unexpected weight change.  HENT: Negative for hearing loss and congestion.   Eyes: Negative for visual disturbance.  Respiratory: Negative for cough.   Cardiovascular: Negative for leg swelling.  Gastrointestinal: Negative for nausea, vomiting and constipation.  Genitourinary: Negative for dysuria and frequency.  Musculoskeletal: Negative for myalgias and arthralgias.  Skin: Negative for rash.  Neurological: Negative for headaches.  Hematological: Negative for adenopathy.  Psychiatric/Behavioral:       Denies depression/anxiety       Objective:   Physical Exam  Physical Exam  Constitutional: She is oriented to person, place, and time. She appears well-developed and well-nourished. No distress.  HENT:  Head: Normocephalic and atraumatic.  Right Ear: Tympanic membrane and ear canal normal.  Left Ear: Tympanic membrane and ear canal normal.  Mouth/Throat: Oropharynx is clear and moist.  Eyes: Pupils are equal, round, and reactive to light. No scleral icterus.  Neck: Normal range of motion. No thyromegaly present.  Cardiovascular: Normal rate and regular rhythm.   No murmur heard. Pulmonary/Chest: Effort normal and breath sounds normal. No respiratory distress. He has no wheezes. She has no rales. She exhibits no tenderness.  Abdominal: Soft. Bowel sounds are normal. He exhibits no distension and no mass. There is no tenderness. There is no rebound and no guarding.  Musculoskeletal: She exhibits no edema.  Lymphadenopathy:    She has no cervical adenopathy.  Neurological: She is alert and oriented to person, place, and time.  She exhibits normal muscle tone. Coordination normal.  Skin: Skin is warm and dry.  Psychiatric: She has a normal mood and affect. Her  behavior is normal. Judgment and thought content normal.  Breast/pelvic: deferred to GYN       Assessment & Plan:         Assessment & Plan:

## 2012-08-16 NOTE — Assessment & Plan Note (Signed)
Continue healthy diet, exercise. Immunizations reviewed and up to date. Obtain flp/lft.

## 2012-08-17 LAB — HEPATIC FUNCTION PANEL
AST: 19 U/L (ref 0–37)
Albumin: 4.4 g/dL (ref 3.5–5.2)
Alkaline Phosphatase: 59 U/L (ref 39–117)
Indirect Bilirubin: 0.4 mg/dL (ref 0.0–0.9)
Total Bilirubin: 0.5 mg/dL (ref 0.3–1.2)
Total Protein: 7.3 g/dL (ref 6.0–8.3)

## 2012-08-17 LAB — LIPID PANEL
HDL: 45 mg/dL (ref >39)
LDL Cholesterol: 160 mg/dL — ABNORMAL HIGH (ref 0–99)
Triglycerides: 119 mg/dL (ref <150)

## 2012-08-19 ENCOUNTER — Encounter: Payer: Self-pay | Admitting: Family

## 2012-08-20 ENCOUNTER — Telehealth: Payer: Self-pay | Admitting: Family

## 2012-08-20 DIAGNOSIS — M8580 Other specified disorders of bone density and structure, unspecified site: Secondary | ICD-10-CM | POA: Insufficient documentation

## 2012-08-20 HISTORY — DX: Other specified disorders of bone density and structure, unspecified site: M85.80

## 2012-08-20 NOTE — Progress Notes (Signed)
  Subjective:    Patient ID: Samantha Landry, female    DOB: 03/22/1946, 67 y.o.   MRN: [id number]  HPI    Review of Systems     Objective:   Physical Exam        Assessment & Plan:

## 2012-08-20 NOTE — Telephone Encounter (Signed)
Test has been added per [facility]. Left message on home # to return my call.

## 2012-08-20 NOTE — Telephone Encounter (Addendum)
Pls call pt and let her know that her cholesterol is elevated.  LDL "bad cholesterol" has increased from 127 to 160 in the last 1 year.  I would like her to work hard on low fat/low cholesterol diet and exercise. LFT normal.  Repeat FLP in 6 months. Also, I received her bone density test from 3/11.  Bones appear thinner than previous study from 2007.  I would like her to change calcium supplement to caltrate 660mg  + D BID to increase her daily calcium. She should get regular weight bearing exercise such as walking. Plan to repeat Dexa in 3 yrs.   Can we add vitamin D level to lab draw (dx is osteopenia?)

## 2012-08-20 NOTE — Telephone Encounter (Signed)
Pt left message returning my call, attempted to reach pt and left message that I will try her again in the morning.

## 2012-08-21 ENCOUNTER — Telehealth: Payer: Self-pay | Admitting: Family

## 2012-08-21 DIAGNOSIS — M8580 Other specified disorders of bone density and structure, unspecified site: Secondary | ICD-10-CM

## 2012-08-21 DIAGNOSIS — E559 Vitamin D deficiency, unspecified: Secondary | ICD-10-CM

## 2012-08-21 MED ORDER — ERGOCALCIFEROL 1.25 MG (50000 UT) PO CAPS: 50000.0000 [IU] | ORAL_CAPSULE | ORAL | Status: DC

## 2012-08-21 NOTE — Telephone Encounter (Signed)
Patient informed, understood & agreed/SLS  

## 2012-08-21 NOTE — Telephone Encounter (Signed)
Vitamin D is low.  Please call pt and let her know I would like her to start vitamin D 40981 units once weekly x 12 weeks then repeat vitamin D level.

## 2012-09-16 ENCOUNTER — Telehealth: Payer: Self-pay | Admitting: *Deleted

## 2012-09-16 NOTE — Telephone Encounter (Signed)
Received message from pt stating she has completed 1 month of vitamin D and has 2 refills remaining on the RX. Pt wants to know when she should return to recheck her vitamin d level. Attempted to reach pt and left detailed message on home# that she should complete the remaining refills for a total of 12 weeks therapy and then repeat the vitamin d level. Advised pt to call if she has any questions.

## 2012-11-28 ENCOUNTER — Telehealth: Payer: Self-pay | Admitting: *Deleted

## 2012-11-28 DIAGNOSIS — E559 Vitamin D deficiency, unspecified: Secondary | ICD-10-CM

## 2012-11-28 NOTE — Telephone Encounter (Signed)
Pt presented to the lab for vitamin d level. Order entered.

## 2012-12-02 ENCOUNTER — Telehealth: Payer: Self-pay | Admitting: Family

## 2012-12-02 MED ORDER — CHOLECALCIFEROL 25 MCG (1000 UT) PO CAPS: 3000.0000 [IU] | ORAL_CAPSULE | Freq: Every day | ORAL | Status: DC

## 2012-12-02 NOTE — Telephone Encounter (Signed)
Vitam D level looks good. She can stop weekly supplement and start otc vitamin d 1000 units- take 3 caps PO daily.

## 2012-12-03 NOTE — Telephone Encounter (Signed)
LMOM with contact name and number for return call RE: results and further provider instructions/SLS  

## 2012-12-05 DIAGNOSIS — H2510 Age-related nuclear cataract, unspecified eye: Secondary | ICD-10-CM | POA: Diagnosis not present

## 2012-12-23 DIAGNOSIS — Z1283 Encounter for screening for malignant neoplasm of skin: Secondary | ICD-10-CM | POA: Diagnosis not present

## 2012-12-23 DIAGNOSIS — Z85820 Personal history of malignant melanoma of skin: Secondary | ICD-10-CM | POA: Diagnosis not present

## 2013-01-27 DIAGNOSIS — H43819 Vitreous degeneration, unspecified eye: Secondary | ICD-10-CM | POA: Diagnosis not present

## 2013-02-05 ENCOUNTER — Telehealth: Payer: Self-pay | Admitting: Internal Medicine

## 2013-02-05 DIAGNOSIS — E785 Hyperlipidemia, unspecified: Secondary | ICD-10-CM

## 2013-02-05 NOTE — Telephone Encounter (Signed)
Upon review of EPIC, pt is due for FLP around 02/16/13. Left detailed message on home# that she is not due for labs until 2 weeks. Lab order entered. Advised pt to call if any questions.

## 2013-02-05 NOTE — Addendum Note (Signed)
Addended by: Mervin Kung A on: 02/05/2013 04:08 PM   Modules accepted: Orders

## 2013-02-05 NOTE — Telephone Encounter (Signed)
02/05/2013  Pt called to see if she is needing to have cholesterol checked.  Says she thinks she was supposed to come in 6 months after prior visit.  Will be in [place] tomorrow and wanted to see if she does need to, if we can scheduler her tomorrow.   Please advise.  bw

## 2013-03-16 DIAGNOSIS — Z23 Encounter for immunization: Secondary | ICD-10-CM | POA: Diagnosis not present

## 2013-05-19 DIAGNOSIS — Z124 Encounter for screening for malignant neoplasm of cervix: Secondary | ICD-10-CM | POA: Diagnosis not present

## 2013-05-19 DIAGNOSIS — Z1212 Encounter for screening for malignant neoplasm of rectum: Secondary | ICD-10-CM | POA: Diagnosis not present

## 2013-08-21 DIAGNOSIS — Z803 Family history of malignant neoplasm of breast: Secondary | ICD-10-CM | POA: Diagnosis not present

## 2013-08-21 DIAGNOSIS — Z1231 Encounter for screening mammogram for malignant neoplasm of breast: Secondary | ICD-10-CM | POA: Diagnosis not present

## 2013-09-22 ENCOUNTER — Ambulatory Visit (INDEPENDENT_AMBULATORY_CARE_PROVIDER_SITE_OTHER): Payer: Medicare Other | Admitting: Family

## 2013-09-22 ENCOUNTER — Encounter: Payer: Self-pay | Admitting: Family

## 2013-09-22 ENCOUNTER — Other Ambulatory Visit: Payer: Self-pay | Admitting: Family

## 2013-09-22 VITALS — BP 126/70 | HR 66 | Temp 97.5°F | Resp 16 | Ht 64.0 in | Wt 140.1 lb

## 2013-09-22 DIAGNOSIS — E559 Vitamin D deficiency, unspecified: Secondary | ICD-10-CM

## 2013-09-22 DIAGNOSIS — Z0000 Encounter for general adult medical examination without abnormal findings: Secondary | ICD-10-CM

## 2013-09-22 DIAGNOSIS — Z23 Encounter for immunization: Secondary | ICD-10-CM | POA: Diagnosis not present

## 2013-09-22 DIAGNOSIS — E785 Hyperlipidemia, unspecified: Secondary | ICD-10-CM | POA: Diagnosis not present

## 2013-09-22 LAB — LIPID PANEL
CHOLESTEROL: 208 mg/dL — AB (ref 0–200)
HDL: 48 mg/dL (ref >39)
LDL Cholesterol: 140 mg/dL — ABNORMAL HIGH (ref 0–99)
TRIGLYCERIDES: 99 mg/dL (ref <150)
Total CHOL/HDL Ratio: 4.3 Ratio
VLDL: 20 mg/dL (ref 0–40)

## 2013-09-22 LAB — HEPATIC FUNCTION PANEL
ALT: 15 U/L (ref 0–35)
AST: 21 U/L (ref 0–37)
Albumin: 4.3 g/dL (ref 3.5–5.2)
Alkaline Phosphatase: 59 U/L (ref 39–117)
BILIRUBIN DIRECT: 0.1 mg/dL (ref 0.0–0.3)
Indirect Bilirubin: 0.5 mg/dL (ref 0.2–1.2)
TOTAL PROTEIN: 7 g/dL (ref 6.0–8.3)
Total Bilirubin: 0.6 mg/dL (ref 0.2–1.2)

## 2013-09-22 NOTE — Assessment & Plan Note (Addendum)
Continue healthy diet, exercise. Weight is good. Prevnar today.  Plan dexa next year.  Obtain fasting labs.

## 2013-09-22 NOTE — Progress Notes (Signed)
Patient ID: Samantha Landry, female   DOB: September 06, 1945, 68 y.o.   MRN: [id number] Subjective:   Patient here for Medicare annual wellness visit and management of other chronic and acute problems.   Patient presents today for complete physical.  Immunizations: due for prevnar Diet: reports healthy diet.  Exercise: walks 30-40 min 5-6 days a week Colonoscopy: due in 2018 Dexa: due 2016 Pap Smear: 2/14 Mammogram: up to date, normal per pt.  Risk factors:  none  Roster of Physicians Providing Medical Care to Patient: Deatra Ina- GI Dr Carren Rang GYN Dr. Marcello Moores- dermatology [facility]  In your present state of health, do you have any difficulty performing the following activities? Preparing food and eating?: No  Bathing yourself: No  Getting dressed: No  Using the toilet:No  Moving around from place to place: No  In the past year have you fallen or had a near fall?:No    Home Safety: Has smoke detector and wears seat belts. + guns under lock. No excess sun exposure.  Diet and Exercise  Current exercise habits: see above Dietary issues discussed: healthy diet   Depression Screen  (Note: if answer to either of the following is "Yes", then a more complete depression screening is indicated)  Q1: Over the past two weeks, have you felt down, depressed or hopeless?no  Q2: Over the past two weeks, have you felt little interest or pleasure in doing things? no   The following portions of the patient's history were reviewed and updated as appropriate: allergies, current medications, past family history, past medical history, past social history, past surgical history and problem list.   Objective:   Vision: see nursing Hearing: able to hear forced whisper at 6 feet Body mass index: Body mass index is 24.04 kg/(m^2). Cognitive Impairment Assessment: cognition, memory and judgment appear normal.   Physical Exam  Constitutional: She is oriented to person, place, and time.  She appears well-developed and well-nourished. No distress.  HENT:  Head: Normocephalic and atraumatic.  Right Ear: Tympanic membrane and ear canal normal.  Left Ear: Tympanic membrane and ear canal normal.  Mouth/Throat: Oropharynx is clear and moist.  Eyes: Pupils are equal, round, and reactive to light. No scleral icterus.  Neck: Normal range of motion. No thyromegaly present.  Cardiovascular: Normal rate and regular rhythm.   No murmur heard. Pulmonary/Chest: Effort normal and breath sounds normal. No respiratory distress. He has no wheezes. She has no rales. She exhibits no tenderness.  Abdominal: Soft. Bowel sounds are normal. He exhibits no distension and no mass. There is no tenderness. There is no rebound and no guarding.  Musculoskeletal: She exhibits no edema.  Lymphadenopathy:    She has no cervical adenopathy.  Neurological: She is alert and oriented to person, place, and time.She exhibits normal muscle tone. Coordination normal.  Skin: Skin is warm and dry.  Psychiatric: She has a normal mood and affect. Her behavior is normal. Judgment and thought content normal.  Breasts: Examined lying Right: Without masses, retractions, discharge or axillary adenopathy.  Left: Without masses, retractions, discharge or axillary adenopathy.  Pelvic: deferred to GYN         Assessment & Plan:    Assessment:   Medicare wellness utd on preventive parameters   Plan:   During the course of the visit the patient was educated and counseled about appropriate screening and preventive services including:       Fall prevention   Screening mammography  Bone densitometry  screening  Diabetes screening  Nutrition counseling   Vaccines / LABS Prevnar Vaccine  today    Patient Instructions (the written plan) was given to the patient.

## 2013-09-22 NOTE — Patient Instructions (Signed)
Please complete lab work prior to leaving. Follow up in 1 year, sooner if problems/concerns.  

## 2013-09-22 NOTE — Progress Notes (Signed)
Pre visit review using our clinic review tool, if applicable. No additional management support is needed unless otherwise documented below in the visit note. 

## 2013-09-23 ENCOUNTER — Encounter: Payer: Self-pay | Admitting: Family

## 2013-09-23 LAB — VITAMIN D 25 HYDROXY (VIT D DEFICIENCY, FRACTURES): VIT D 25 HYDROXY: 54 ng/mL (ref 30–89)

## 2013-12-09 DIAGNOSIS — L820 Inflamed seborrheic keratosis: Secondary | ICD-10-CM | POA: Diagnosis not present

## 2013-12-09 DIAGNOSIS — Z85820 Personal history of malignant melanoma of skin: Secondary | ICD-10-CM | POA: Diagnosis not present

## 2013-12-09 DIAGNOSIS — D485 Neoplasm of uncertain behavior of skin: Secondary | ICD-10-CM | POA: Diagnosis not present

## 2014-02-19 DIAGNOSIS — H2510 Age-related nuclear cataract, unspecified eye: Secondary | ICD-10-CM | POA: Diagnosis not present

## 2014-02-19 DIAGNOSIS — H25019 Cortical age-related cataract, unspecified eye: Secondary | ICD-10-CM | POA: Diagnosis not present

## 2014-03-03 DIAGNOSIS — Z23 Encounter for immunization: Secondary | ICD-10-CM | POA: Diagnosis not present

## 2014-05-26 DIAGNOSIS — Z01419 Encounter for gynecological examination (general) (routine) without abnormal findings: Secondary | ICD-10-CM | POA: Diagnosis not present

## 2014-07-27 DIAGNOSIS — M2012 Hallux valgus (acquired), left foot: Secondary | ICD-10-CM | POA: Diagnosis not present

## 2014-09-01 ENCOUNTER — Telehealth: Payer: Self-pay | Admitting: Family

## 2014-09-01 NOTE — Telephone Encounter (Signed)
Pre visit letter sent  °

## 2014-09-11 DIAGNOSIS — N6012 Diffuse cystic mastopathy of left breast: Secondary | ICD-10-CM | POA: Diagnosis not present

## 2014-09-11 DIAGNOSIS — Z1231 Encounter for screening mammogram for malignant neoplasm of breast: Secondary | ICD-10-CM | POA: Diagnosis not present

## 2014-09-11 DIAGNOSIS — E559 Vitamin D deficiency, unspecified: Secondary | ICD-10-CM | POA: Diagnosis not present

## 2014-09-11 DIAGNOSIS — Z803 Family history of malignant neoplasm of breast: Secondary | ICD-10-CM | POA: Diagnosis not present

## 2014-09-11 LAB — HM MAMMOGRAPHY

## 2014-09-15 DIAGNOSIS — Z803 Family history of malignant neoplasm of breast: Secondary | ICD-10-CM | POA: Diagnosis not present

## 2014-09-15 DIAGNOSIS — Z1231 Encounter for screening mammogram for malignant neoplasm of breast: Secondary | ICD-10-CM | POA: Diagnosis not present

## 2014-09-15 DIAGNOSIS — E559 Vitamin D deficiency, unspecified: Secondary | ICD-10-CM | POA: Diagnosis not present

## 2014-09-15 DIAGNOSIS — N6012 Diffuse cystic mastopathy of left breast: Secondary | ICD-10-CM | POA: Diagnosis not present

## 2014-09-21 ENCOUNTER — Encounter: Payer: Medicare Other | Admitting: Family

## 2014-09-23 ENCOUNTER — Telehealth: Payer: Self-pay | Admitting: Family

## 2014-09-23 NOTE — Telephone Encounter (Signed)
Pre Visit letter sent  °

## 2014-10-01 ENCOUNTER — Encounter: Payer: Self-pay | Admitting: Family

## 2014-10-07 ENCOUNTER — Encounter: Payer: Self-pay | Admitting: Family

## 2014-10-14 ENCOUNTER — Telehealth: Payer: Self-pay

## 2014-10-14 NOTE — Telephone Encounter (Signed)
Pre visit call completed 

## 2014-10-16 ENCOUNTER — Ambulatory Visit (INDEPENDENT_AMBULATORY_CARE_PROVIDER_SITE_OTHER): Payer: Medicare Other | Admitting: Family

## 2014-10-16 ENCOUNTER — Encounter: Payer: Self-pay | Admitting: Family

## 2014-10-16 VITALS — BP 110/70 | HR 66 | Temp 97.8°F | Resp 16 | Ht 63.5 in | Wt 141.2 lb

## 2014-10-16 DIAGNOSIS — Z0000 Encounter for general adult medical examination without abnormal findings: Secondary | ICD-10-CM

## 2014-10-16 DIAGNOSIS — E785 Hyperlipidemia, unspecified: Secondary | ICD-10-CM | POA: Diagnosis not present

## 2014-10-16 DIAGNOSIS — E559 Vitamin D deficiency, unspecified: Secondary | ICD-10-CM

## 2014-10-16 DIAGNOSIS — R252 Cramp and spasm: Secondary | ICD-10-CM | POA: Diagnosis not present

## 2014-10-16 LAB — BASIC METABOLIC PANEL
BUN: 14 mg/dL (ref 6–23)
CALCIUM: 10.5 mg/dL (ref 8.4–10.5)
CO2: 28 mEq/L (ref 19–32)
Chloride: 103 mEq/L (ref 96–112)
Creatinine, Ser: 0.7 mg/dL (ref 0.40–1.20)
GFR: 88.49 mL/min (ref >60.00)
Glucose, Bld: 96 mg/dL (ref 70–99)
Potassium: 4.6 mEq/L (ref 3.5–5.1)
Sodium: 137 mEq/L (ref 135–145)

## 2014-10-16 LAB — LIPID PANEL
Cholesterol: 240 mg/dL — ABNORMAL HIGH (ref 0–200)
HDL: 54.5 mg/dL (ref >39.00)
LDL Cholesterol: 156 mg/dL — ABNORMAL HIGH (ref 0–99)
NONHDL: 185.5
Total CHOL/HDL Ratio: 4
Triglycerides: 149 mg/dL (ref 0.0–149.0)
VLDL: 29.8 mg/dL (ref 0.0–40.0)

## 2014-10-16 LAB — VITAMIN D 25 HYDROXY (VIT D DEFICIENCY, FRACTURES): VITD: 36.35 ng/mL (ref 30.00–100.00)

## 2014-10-16 NOTE — Patient Instructions (Signed)
Please complete lab work prior to leaving. Follow up in 1 year, sooner if problems/concerns.  

## 2014-10-16 NOTE — Progress Notes (Signed)
Pre visit review using our clinic review tool, if applicable. No additional management support is needed unless otherwise documented below in the visit note. 

## 2014-10-16 NOTE — Progress Notes (Signed)
Patient ID: Samantha Landry, female   DOB: 1946-05-25, 69 y.o.   MRN: [id number] Subjective:    Samantha Landry is a 69 y.o. female who presents for Medicare Annual/Subsequent preventive examination.  Preventive Screening-Counseling & Management  Tobacco History  Smoking status  . Never Smoker   Smokeless tobacco  . Never Used     Problems Prior to Visit 1.  Osteopenia/Vit D deficiency- on supplement.   2.  Pulmonary nodule- Reports that this was initially diagnosed by her old MD and that her x-rays from [place] and this was compared to new imaging with Dr. Shawna Orleans and was felt to be stable.   3.  Hyperlipidemia- trying to work on low fat diet/exercise.  Lab Results  Component Value Date   CHOL 208* 09/22/2013   HDL 48 09/22/2013   LDLCALC 140* 09/22/2013   LDLDIRECT 142.9 04/04/2007   TRIG 99 09/22/2013   CHOLHDL 4.3 09/22/2013    4. Preventative health care-  Immunizations: up to date Diet: healthy Exercise: walks 4-5 days a week for 30-40 minutes Colonoscopy: 4/15 Dexa: 4/16 Pap Smear:12/15 Mammogram: 4/16   Current Problems (verified) Patient Active Problem List   Diagnosis Date Noted  . Osteopenia 08/20/2012  . Routine general medical examination at a health care facility 08/16/2012  . Alopecia 08/03/2011  . Other and unspecified hyperlipidemia 01/30/2011  . DIVERTICULOSIS, COLON 03/26/2007  . SKIN CANCER, HX OF 03/26/2007  . PULMONARY NODULE 03/25/2007  . HOT FLASHES 03/25/2007    Medications Prior to Visit Current Outpatient Prescriptions on File Prior to Visit  Medication Sig Dispense Refill  . aspirin 81 MG tablet Take 81 mg by mouth daily.      . Calcium-Vitamin D-Vitamin K [id number] MG-UNT-MCG TABS Take 1 tablet by mouth daily.    . Cholecalciferol ([employer]S VITAMIN D3) 1000 UNITS capsule Take 3 capsules (3,000 Units total) by mouth daily.    . fish oil-omega-3 fatty acids 1000 MG capsule Take 1 capsule by mouth daily.       No current facility-administered  medications on file prior to visit.    Current Medications (verified) Current Outpatient Prescriptions  Medication Sig Dispense Refill  . aspirin 81 MG tablet Take 81 mg by mouth daily.      . Calcium-Vitamin D-Vitamin K [id number] MG-UNT-MCG TABS Take 1 tablet by mouth daily.    . Cholecalciferol (CVS VITAMIN D3) 1000 UNITS capsule Take 3 capsules (3,000 Units total) by mouth daily.    . fish oil-omega-3 fatty acids 1000 MG capsule Take 1 capsule by mouth daily.       No current facility-administered medications for this visit.     Allergies (verified) Codeine   PAST HISTORY  Family History Family History  Problem Relation Age of Onset  . Cancer Father     lung  . Colon cancer Cousin     1st pat.cousin  . Rectal cancer Neg Hx   . Stomach cancer Neg Hx     Social History History  Substance Use Topics  . Smoking status: Never Smoker   . Smokeless tobacco: Never Used  . Alcohol Use: Yes     Comment: less than 1 drink of wine a week     Are there smokers in your home (other than you)? No  Risk Factors Current exercise habits: walks regularly  Dietary issues discussed: healthy diet    Cardiac risk factors: advanced age (older than 49 for men, 15 for women).  Depression Screen (Note: if answer to either  of the following is "Yes", a more complete depression screening is indicated)   Over the past two weeks, have you felt down, depressed or hopeless? No  Over the past two weeks, have you felt little interest or pleasure in doing things? No  Have you lost interest or pleasure in daily life? No  Do you often feel hopeless? No  Do you cry easily over simple problems? No  Activities of Daily Living In your present state of health, do you have any difficulty performing the following activities?:  Driving? No Managing money?  No Feeding yourself? No Getting from bed to chair? No. Climbing a flight of stairs? No Preparing food and eating?: No Bathing or showering?  No Getting dressed: No Getting to the toilet? No Using the toilet:No Moving around from place to place: No In the past year have you fallen or had a near fall?:No   Are you sexually active?  Yes  Do you have more than one partner?  No  Hearing Difficulties: No Do you often ask people to speak up or repeat themselves? No Do you experience ringing or noises in your ears? No Do you have difficulty understanding soft or whispered voices? No   Do you feel that you have a problem with memory? No  Do you often misplace items? No  Do you feel safe at home?  Yes  Cognitive Testing  Alert? Yes  Normal Appearance?Yes  Oriented to person? Yes  Place? Yes   Time? Yes  Recall of three objects?  Yes  Can perform simple calculations? Yes  Displays appropriate judgment?Yes  Can read the correct time from a watch face?Yes   Advanced Directives have been discussed with the patient? Yes  List the Names of Other Physician/Practitioners you currently use: 1.  Dr. Carren Rang- GYN  Indicate any recent Medical Services you may have received from other than [facility] providers in the past year (date may be approximate).  Immunization History  Administered Date(s) Administered  . Influenza Split 03/05/2012  . Influenza Whole 03/22/2010  . Pneumococcal Conjugate-13 09/22/2013  . Pneumococcal Polysaccharide-23 03/05/2012  . Tdap 10/26/2010  . Zoster 10/26/2010    Screening Tests Health Maintenance  Topic Date Due  . INFLUENZA VACCINE  01/04/2015  . MAMMOGRAM  09/10/2016  . COLONOSCOPY  04/23/2017  . TETANUS/TDAP  10/25/2020  . DEXA SCAN  Addressed  . ZOSTAVAX  Completed  . PNA vac Low Risk Adult  Completed    All answers were reviewed with the patient and necessary referrals were made:  O'SULLIVAN,Jersie Beel S., NP   10/16/2014   History reviewed: allergies, current medications, past family history, past medical history, past social history, past surgical history and problem list  Review of  Systems   Review of Systems  Constitutional: Negative for unexpected weight change.  HENT: Negative for hearing loss and rhinorrhea.   Eyes: Negative for visual disturbance.  Respiratory: Negative for cough.   Cardiovascular: Negative for chest pain and leg swelling.  Gastrointestinal: Negative for nausea, vomiting, diarrhea and blood in stool.  Genitourinary: Negative for dysuria and frequency.  Musculoskeletal: Negative for myalgias and arthralgias. Does report some foot/leg cramps in the last few months.  Will have bunion surgery  Skin: Negative for rash.  Neurological: Negative for headaches.  Hematological: Negative for adenopathy.  Psychiatric/Behavioral: Denies depression or anxiety   Objective:       Body mass index is 24.62 kg/(m^2). BP 110/70 mmHg  Pulse 66  Temp(Src) 97.8 F (36.6 C) (Oral)  Resp 16  Ht 5' 3.5" (1.613 m)  Wt 141 lb 3.2 oz (64.048 kg)  BMI 24.62 kg/m2  SpO2 99%   Physical Exam  Constitutional: She is oriented to person, place, and time. She appears well-developed and well-nourished. No distress.  HENT:  Head: Normocephalic and atraumatic.  Right Ear: Tympanic membrane and ear canal normal.  Left Ear: Tympanic membrane and ear canal normal.  Mouth/Throat: Oropharynx is clear and moist.  Eyes: Pupils are equal, round, and reactive to light. No scleral icterus.  Neck: Normal range of motion. No thyromegaly present.  Cardiovascular: Normal rate and regular rhythm.   No murmur heard. Pulmonary/Chest: Effort normal and breath sounds normal. No respiratory distress. He has no wheezes. She has no rales. She exhibits no tenderness.  Abdominal: Soft. Bowel sounds are normal. He exhibits no distension and no mass. There is no tenderness. There is no rebound and no guarding.  Musculoskeletal: She exhibits no edema.  Lymphadenopathy:    She has no cervical adenopathy.  Neurological: She is alert and oriented to person, place, and time. She has normal  reflexes. She exhibits normal muscle tone. Coordination normal.  Skin: Skin is warm and dry.  Psychiatric: She has a normal mood and affect. Her behavior is normal. Judgment and thought content normal.  Breast/pelvic:  deferred         Assessment & Plan:      Assessment:          Plan:     During the course of the visit the patient was educated and counseled about appropriate screening and preventive services including:    Advanced directives: patient provided paperwork for MOST form and HCPOA to review  Diet review for nutrition referral? Yes ____  Not Indicated __x__   Patient Instructions (the written plan) was given to the patient.  Medicare Attestation I have personally reviewed: The patient's medical and social history Their use of alcohol, tobacco or illicit drugs Their current medications and supplements The patient's functional ability including ADLs,fall risks, home safety risks, cognitive, and hearing and visual impairment Diet and physical activities Evidence for depression or mood disorders  The patient's weight, height, BMI, and visual acuity have been recorded in the chart.  I have made referrals, counseling, and provided education to the patient based on review of the above and I have provided the patient with a written personalized care plan for preventive services.     O'SULLIVAN,Rylan Kaufmann S., NP   10/16/2014

## 2014-10-18 ENCOUNTER — Encounter: Payer: Self-pay | Admitting: Family

## 2014-10-18 DIAGNOSIS — E785 Hyperlipidemia, unspecified: Secondary | ICD-10-CM

## 2014-10-19 MED ORDER — ATORVASTATIN CALCIUM 20 MG PO TABS: 20.0000 mg | ORAL_TABLET | Freq: Every day | ORAL | Status: DC

## 2014-11-24 DIAGNOSIS — M2012 Hallux valgus (acquired), left foot: Secondary | ICD-10-CM | POA: Diagnosis not present

## 2014-11-24 DIAGNOSIS — Q662 Congenital metatarsus (primus) varus: Secondary | ICD-10-CM | POA: Diagnosis not present

## 2014-11-24 DIAGNOSIS — M216X2 Other acquired deformities of left foot: Secondary | ICD-10-CM | POA: Diagnosis not present

## 2014-11-24 DIAGNOSIS — G8918 Other acute postprocedural pain: Secondary | ICD-10-CM | POA: Diagnosis not present

## 2014-12-09 ENCOUNTER — Other Ambulatory Visit: Payer: Medicare Other

## 2014-12-09 DIAGNOSIS — Z4789 Encounter for other orthopedic aftercare: Secondary | ICD-10-CM | POA: Diagnosis not present

## 2014-12-09 DIAGNOSIS — M2012 Hallux valgus (acquired), left foot: Secondary | ICD-10-CM | POA: Diagnosis not present

## 2014-12-17 ENCOUNTER — Other Ambulatory Visit (INDEPENDENT_AMBULATORY_CARE_PROVIDER_SITE_OTHER): Payer: Medicare Other

## 2014-12-17 DIAGNOSIS — E785 Hyperlipidemia, unspecified: Secondary | ICD-10-CM | POA: Diagnosis not present

## 2014-12-17 LAB — LIPID PANEL
Cholesterol: 144 mg/dL (ref 0–200)
HDL: 47.5 mg/dL (ref >39.00)
LDL Cholesterol: 72 mg/dL (ref 0–99)
NonHDL: 96.5
TRIGLYCERIDES: 123 mg/dL (ref 0.0–149.0)
Total CHOL/HDL Ratio: 3
VLDL: 24.6 mg/dL (ref 0.0–40.0)

## 2014-12-20 ENCOUNTER — Encounter: Payer: Self-pay | Admitting: Family

## 2014-12-22 DIAGNOSIS — Z85820 Personal history of malignant melanoma of skin: Secondary | ICD-10-CM | POA: Diagnosis not present

## 2014-12-22 DIAGNOSIS — D229 Melanocytic nevi, unspecified: Secondary | ICD-10-CM | POA: Diagnosis not present

## 2015-01-06 DIAGNOSIS — M2012 Hallux valgus (acquired), left foot: Secondary | ICD-10-CM | POA: Diagnosis not present

## 2015-01-06 DIAGNOSIS — Z4789 Encounter for other orthopedic aftercare: Secondary | ICD-10-CM | POA: Diagnosis not present

## 2015-02-10 DIAGNOSIS — Z4789 Encounter for other orthopedic aftercare: Secondary | ICD-10-CM | POA: Diagnosis not present

## 2015-02-11 ENCOUNTER — Telehealth: Payer: Self-pay | Admitting: Family

## 2015-02-11 NOTE — Telephone Encounter (Signed)
Pharmacy: [facility] in [place]  Reason for call: Pt called for refill on atorvastatin. She has meds thru Sunday. Takes 1/day.

## 2015-02-12 MED ORDER — ATORVASTATIN CALCIUM 20 MG PO TABS: 20.0000 mg | ORAL_TABLET | Freq: Every day | ORAL | Status: DC

## 2015-02-12 NOTE — Telephone Encounter (Signed)
Refill sent, notified pt. 

## 2015-02-23 DIAGNOSIS — Z23 Encounter for immunization: Secondary | ICD-10-CM | POA: Diagnosis not present

## 2015-03-10 ENCOUNTER — Telehealth: Payer: Self-pay | Admitting: Family

## 2015-03-10 DIAGNOSIS — Z0000 Encounter for general adult medical examination without abnormal findings: Secondary | ICD-10-CM

## 2015-03-10 NOTE — Telephone Encounter (Signed)
Relation to BM:SXJD Call back [place]:  Reason for call:  Patient requesting a referral to [facility], [place], [place] [address] [phone] patient appointment for routine eye care  03/29/15.

## 2015-03-29 DIAGNOSIS — N6002 Solitary cyst of left breast: Secondary | ICD-10-CM | POA: Diagnosis not present

## 2015-03-30 ENCOUNTER — Encounter: Payer: Self-pay | Admitting: Family

## 2015-04-15 ENCOUNTER — Encounter: Payer: Self-pay | Admitting: Family

## 2015-04-22 DIAGNOSIS — H2513 Age-related nuclear cataract, bilateral: Secondary | ICD-10-CM | POA: Diagnosis not present

## 2015-04-22 DIAGNOSIS — H25013 Cortical age-related cataract, bilateral: Secondary | ICD-10-CM | POA: Diagnosis not present

## 2015-07-30 DIAGNOSIS — S61211A Laceration without foreign body of left index finger without damage to nail, initial encounter: Secondary | ICD-10-CM | POA: Diagnosis not present

## 2015-08-06 DIAGNOSIS — Z4789 Encounter for other orthopedic aftercare: Secondary | ICD-10-CM | POA: Diagnosis not present

## 2015-08-06 DIAGNOSIS — S61211D Laceration without foreign body of left index finger without damage to nail, subsequent encounter: Secondary | ICD-10-CM | POA: Diagnosis not present

## 2015-10-05 DIAGNOSIS — N63 Unspecified lump in breast: Secondary | ICD-10-CM | POA: Diagnosis not present

## 2015-10-05 LAB — HM MAMMOGRAPHY: HM MAMMO: ABNORMAL — AB (ref 0–4)

## 2015-10-18 ENCOUNTER — Telehealth: Payer: Self-pay | Admitting: Behavioral Health

## 2015-10-18 NOTE — Telephone Encounter (Signed)
Unable to reach patient at time of Pre-Visit Call.  Left message for patient to return call when available.    

## 2015-10-19 ENCOUNTER — Encounter: Payer: Self-pay | Admitting: Family

## 2015-10-19 ENCOUNTER — Ambulatory Visit (HOSPITAL_BASED_OUTPATIENT_CLINIC_OR_DEPARTMENT_OTHER)
Admission: RE | Admit: 2015-10-19 | Discharge: 2015-10-19 | Disposition: A | Discharge status: Home / Self Care | Payer: Medicare Other | Source: Ambulatory Visit | Attending: Family | Admitting: Family

## 2015-10-19 ENCOUNTER — Other Ambulatory Visit (HOSPITAL_COMMUNITY)
Admission: RE | Admit: 2015-10-19 | Discharge: 2015-10-19 | Disposition: A | Discharge status: Home / Self Care | Payer: Medicare Other | Source: Ambulatory Visit | Attending: Family | Admitting: Family

## 2015-10-19 ENCOUNTER — Ambulatory Visit (INDEPENDENT_AMBULATORY_CARE_PROVIDER_SITE_OTHER): Payer: Medicare Other | Admitting: Family

## 2015-10-19 VITALS — BP 96/70 | HR 66 | Temp 98.6°F | Resp 16 | Ht 64.0 in | Wt 137.8 lb

## 2015-10-19 DIAGNOSIS — Z01419 Encounter for gynecological examination (general) (routine) without abnormal findings: Secondary | ICD-10-CM | POA: Insufficient documentation

## 2015-10-19 DIAGNOSIS — Z1151 Encounter for screening for human papillomavirus (HPV): Secondary | ICD-10-CM | POA: Insufficient documentation

## 2015-10-19 DIAGNOSIS — E2839 Other primary ovarian failure: Secondary | ICD-10-CM

## 2015-10-19 DIAGNOSIS — R918 Other nonspecific abnormal finding of lung field: Secondary | ICD-10-CM | POA: Insufficient documentation

## 2015-10-19 DIAGNOSIS — R911 Solitary pulmonary nodule: Secondary | ICD-10-CM | POA: Diagnosis not present

## 2015-10-19 DIAGNOSIS — E785 Hyperlipidemia, unspecified: Secondary | ICD-10-CM

## 2015-10-19 DIAGNOSIS — Z0000 Encounter for general adult medical examination without abnormal findings: Secondary | ICD-10-CM | POA: Diagnosis not present

## 2015-10-19 LAB — LIPID PANEL
Cholesterol: 145 mg/dL (ref 0–200)
HDL: 44.7 mg/dL (ref >39.00)
LDL Cholesterol: 80 mg/dL (ref 0–99)
NONHDL: 100.63
Total CHOL/HDL Ratio: 3
Triglycerides: 101 mg/dL (ref 0.0–149.0)
VLDL: 20.2 mg/dL (ref 0.0–40.0)

## 2015-10-19 NOTE — Patient Instructions (Addendum)
Please complete lab work prior to leaving.  Call [facility] to schedule an appointment in [facility] with one of our therapists  [phone]. You will be contacted about scheduling your bone density. Please complete x ray on the first floor.

## 2015-10-19 NOTE — Progress Notes (Signed)
Pre visit review using our clinic review tool, if applicable. No additional management support is needed unless otherwise documented below in the visit note. 

## 2015-10-19 NOTE — Progress Notes (Signed)
Subjective:    Samantha Landry is a 70 y.o. female who presents for Medicare Annual/Subsequent preventive examination.  Preventive Screening-Counseling & Management  Tobacco History  Smoking status  . Never Smoker   Smokeless tobacco  . Never Used     Problems Prior to Visit 1.  Osteopenia- on calcium supplement.   2.  Hx of skin cancer- sees Dr. Marcello Moores in [facility] (dermatology)  3.  Hyperlipidemia- good compliance with statin, denies myalgia.  Lab Results  Component Value Date   CHOL 144 12/17/2014   HDL 47.50 12/17/2014   LDLCALC 72 12/17/2014   LDLDIRECT 142.9 04/04/2007   TRIG 123.0 12/17/2014   CHOLHDL 3 12/17/2014    Patient presents today for complete physical.  Immunizations: up to date Diet: working on healthy diet Exercise:  Enjoys walking Colonoscopy: 2013, due 2018 Dexa:due Pap Smear: last pap per GYN would like to do today Mammogram: has cyst (done at [facility]) Will have aspirated tomorrow.   Anxiety-  Husband has dementia, DM, HTN. Feels pressure at home because hsband.  Reports + panic attacks intermittently.  wlaking is an outlet.     Current Problems (verified) Patient Active Problem List   Diagnosis Date Noted  . Osteopenia 08/20/2012  . Routine general medical examination at a health care facility 08/16/2012  . Alopecia 08/03/2011  . Other and unspecified hyperlipidemia 01/30/2011  . DIVERTICULOSIS, COLON 03/26/2007  . SKIN CANCER, HX OF 03/26/2007  . PULMONARY NODULE 03/25/2007  . HOT FLASHES 03/25/2007    Medications Prior to Visit Current Outpatient Prescriptions on File Prior to Visit  Medication Sig Dispense Refill  . aspirin 81 MG tablet Take 81 mg by mouth daily.      Marland Kitchen atorvastatin (LIPITOR) 20 MG tablet Take 1 tablet (20 mg total) by mouth daily. 30 tablet 8  . Calcium-Vitamin D-Vitamin K [id number] MG-UNT-MCG TABS Take 1 tablet by mouth daily.    . Cholecalciferol (CVS VITAMIN D3) 1000 UNITS capsule Take 3 capsules (3,000 Units  total) by mouth daily.    . fish oil-omega-3 fatty acids 1000 MG capsule Take 1 capsule by mouth daily.       No current facility-administered medications on file prior to visit.    Current Medications (verified) Current Outpatient Prescriptions  Medication Sig Dispense Refill  . aspirin 81 MG tablet Take 81 mg by mouth daily.      Marland Kitchen atorvastatin (LIPITOR) 20 MG tablet Take 1 tablet (20 mg total) by mouth daily. 30 tablet 8  . Calcium-Vitamin D-Vitamin K [id number] MG-UNT-MCG TABS Take 1 tablet by mouth daily.    . Cholecalciferol (CVS VITAMIN D3) 1000 UNITS capsule Take 3 capsules (3,000 Units total) by mouth daily.    . fish oil-omega-3 fatty acids 1000 MG capsule Take 1 capsule by mouth daily.       No current facility-administered medications for this visit.     Allergies (verified) Codeine   PAST HISTORY  Family History Family History  Problem Relation Age of Onset  . Cancer Father     lung  . Colon cancer Cousin     1st pat.cousin  . Rectal cancer Neg Hx   . Stomach cancer Neg Hx     Social History Social History  Substance Use Topics  . Smoking status: Never Smoker   . Smokeless tobacco: Never Used  . Alcohol Use: Yes     Comment: less than 1 drink of wine a week     Are there smokers in your  home (other than you)? No  Risk Factors Current exercise habits: exercises regular  Dietary issues discussed: continue healthy diet   Cardiac risk factors: advanced age (older than 27 for men, 73 for women) and dyslipidemia.  Depression Screen (Note: if answer to either of the following is "Yes", a more complete depression screening is indicated)   Over the past two weeks, have you felt down, depressed or hopeless? No  Over the past two weeks, have you felt little interest or pleasure in doing things? No  Have you lost interest or pleasure in daily life? No  Do you often feel hopeless? No  Do you cry easily over simple problems? No  Activities of Daily Living In  your present state of health, do you have any difficulty performing the following activities?:  Driving? No Managing money?  No Feeding yourself? No Getting from bed to chair? No . Climbing a flight of stairs? No Preparing food and eating?: No Bathing or showering? No Getting dressed: No Getting to the toilet? No Using the toilet:No Moving around from place to place: No In the past year have you fallen or had a near fall?:No   Are you sexually active?  No  Do you have more than one partner?  No  Hearing Difficulties: No Do you often ask people to speak up or repeat themselves? No Do you experience ringing or noises in your ears? No Do you have difficulty understanding soft or whispered voices? No   Do you feel that you have a problem with memory? No  Do you often misplace items? No  Do you feel safe at home?  Yes  Cognitive Testing  Alert? Yes  Normal Appearance?Yes  Oriented to person? No  Place? Yes   Time? Yes  Recall of three objects?  Yes  Can perform simple calculations? Yes  Displays appropriate judgment?Yes  Can read the correct time from a watch face?Yes   Advanced Directives have been discussed with the patient? Yes  List the Names of Other Physician/Practitioners you currently use: See care team  Indicate any recent Medical Services you may have received from other than [facility] providers in the past year (date may be approximate).  Immunization History  Administered Date(s) Administered  . Influenza Split 03/05/2012  . Influenza Whole 03/22/2010  . Influenza, High Dose Seasonal PF 02/23/2015  . Pneumococcal Conjugate-13 09/22/2013  . Pneumococcal Polysaccharide-23 03/05/2012  . Tdap 10/26/2010  . Zoster 10/26/2010    Screening Tests Health Maintenance  Topic Date Due  . Hepatitis C Screening  1945-09-05  . INFLUENZA VACCINE  01/04/2016  . MAMMOGRAM  09/10/2016  . COLONOSCOPY  04/23/2017  . TETANUS/TDAP  10/25/2020  . DEXA SCAN  Addressed  .  ZOSTAVAX  Completed  . PNA vac Low Risk Adult  Completed    All answers were reviewed with the patient and necessary referrals were made:  O'SULLIVAN,Journii Nierman S., NP   10/19/2015   History reviewed: allergies, current medications, past family history, past medical history, past social history, past surgical history and problem list  Review of Systems Pertinent items are noted in HPI.    Objective:     Vision by Snellen chart: right eye:,, left eye:20/20  Body mass index is 23.64 kg/(m^2). Ht 5\' 4"  (1.626 m)  Wt 137 lb 12.8 oz (62.506 kg)  BMI 23.64 kg/m2  Physical Exam  Constitutional: She is oriented to person, place, and time. She appears well-developed and well-nourished. No distress.  HENT:  Head: Normocephalic and  atraumatic.  Right Ear: Tympanic membrane and ear canal normal.  Left Ear: Tympanic membrane and ear canal normal.  Mouth/Throat: Oropharynx is clear and moist.  Eyes: Pupils are equal, round, and reactive to light. No scleral icterus.  Neck: Normal range of motion. No thyromegaly present.  Cardiovascular: Normal rate and regular rhythm.   No murmur heard. Pulmonary/Chest: Effort normal and breath sounds normal. No respiratory distress. He has no wheezes. She has no rales. She exhibits no tenderness.  Abdominal: Soft. Bowel sounds are normal. He exhibits no distension and no mass. There is no tenderness. There is no rebound and no guarding.  Musculoskeletal: She exhibits no edema.  Lymphadenopathy:    She has no cervical adenopathy.  Neurological: She is alert and oriented to person, place, and time. She has normal reflexes. She exhibits normal muscle tone. Coordination normal.  Skin: Skin is warm and dry.  Psychiatric: She has a normal mood and affect. Her behavior is normal. Judgment and thought content normal.  Breasts: Examined lying Right: Without masses, retractions, discharge or axillary adenopathy.  Left: Without masses, retractions, discharge or  axillary adenopathy.  Inguinal/mons: Normal without inguinal adenopathy  External genitalia: Normal  BUS/Urethra/Skene's glands: Normal  Bladder: Normal  Vagina: Normal  Cervix: Normal  Uterus: normal in size, shape and contour. Midline and mobile  Adnexa/parametria:  Rt: Without masses or tenderness.  Lt: Without masses or tenderness.  Anus and perineum: Normal           Assessment & Plan:        Assessment:          Plan:     During the course of the visit the patient was educated and counseled about appropriate screening and preventive services including:    Screening mammography  Screening Pap smear and pelvic exam   Bone densitometry screening  Diet review for nutrition referral? Yes ____  Not Indicated _x___   Patient Instructions (the written plan) was given to the patient.  Medicare Attestation I have personally reviewed: The patient's medical and social history Their use of alcohol, tobacco or illicit drugs Their current medications and supplements The patient's functional ability including ADLs,fall risks, home safety risks, cognitive, and hearing and visual impairment Diet and physical activities Evidence for depression or mood disorders  The patient's weight, height, BMI, and visual acuity have been recorded in the chart.  I have made referrals, counseling, and provided education to the patient based on review of the above and I have provided the patient with a written personalized care plan for preventive services.     O'SULLIVAN,Areli Jowett S., NP   10/19/2015

## 2015-10-20 DIAGNOSIS — N6002 Solitary cyst of left breast: Secondary | ICD-10-CM | POA: Diagnosis not present

## 2015-10-20 LAB — CYTOLOGY - PAP

## 2015-10-26 ENCOUNTER — Encounter: Payer: Self-pay | Admitting: Family

## 2015-10-29 ENCOUNTER — Telehealth: Payer: Self-pay | Admitting: Family

## 2015-10-29 MED ORDER — ESCITALOPRAM OXALATE 10 MG PO TABS: ORAL_TABLET | ORAL | Status: DC

## 2015-10-29 NOTE — Addendum Note (Signed)
Addended by: Debbrah Alar on: 10/29/2015 05:16 PM   Modules accepted: Orders

## 2015-10-29 NOTE — Telephone Encounter (Signed)
See mychart.  

## 2015-11-07 ENCOUNTER — Other Ambulatory Visit: Payer: Self-pay | Admitting: Family

## 2015-11-08 NOTE — Telephone Encounter (Signed)
Rx sent to the pharmacy by e-script.//AB/CMA 

## 2015-11-17 ENCOUNTER — Encounter: Payer: Self-pay | Admitting: Family

## 2015-11-25 ENCOUNTER — Encounter: Payer: Self-pay | Admitting: Family

## 2015-11-25 ENCOUNTER — Ambulatory Visit (INDEPENDENT_AMBULATORY_CARE_PROVIDER_SITE_OTHER): Payer: Medicare Other | Admitting: Family

## 2015-11-25 VITALS — BP 119/57 | HR 62 | Temp 98.0°F | Resp 16 | Ht 64.0 in | Wt 139.4 lb

## 2015-11-25 DIAGNOSIS — F419 Anxiety disorder, unspecified: Principal | ICD-10-CM

## 2015-11-25 DIAGNOSIS — F418 Other specified anxiety disorders: Secondary | ICD-10-CM

## 2015-11-25 DIAGNOSIS — F32A Depression, unspecified: Secondary | ICD-10-CM

## 2015-11-25 DIAGNOSIS — F329 Major depressive disorder, single episode, unspecified: Secondary | ICD-10-CM | POA: Insufficient documentation

## 2015-11-25 HISTORY — DX: Depression, unspecified: F32.A

## 2015-11-25 HISTORY — DX: Anxiety disorder, unspecified: F41.9

## 2015-11-25 MED ORDER — ESCITALOPRAM OXALATE 10 MG PO TABS: 10.0000 mg | ORAL_TABLET | Freq: Every day | ORAL | Status: DC

## 2015-11-25 NOTE — Progress Notes (Signed)
Subjective:    Patient ID: Samantha Landry, female    DOB: 05-23-1946, 70 y.o.   MRN: [id number]  HPI  Samantha Landry is a 70 yr old female who presents today to discuss anxiety.  She reports that she has had a lot of anxiety (husband is ill), sister has a lot of things going on. Was not sleeping well, was emotional over things that normally would not bother her. Felt like she was getting "short with people."  She started lexapro about 1 month ago. Since beginning lexapro she notes that little things are not bothering her as much. Reports that she is better able to fall back to sleep now that she is on the medications. She denies side effects.   Wt Readings from Last 3 Encounters:  11/25/15 139 lb 6.4 oz (63.231 kg)  10/19/15 137 lb 12.8 oz (62.506 kg)  10/16/14 141 lb 3.2 oz (64.048 kg)     Review of Systems See HPI  Past Medical History  Diagnosis Date  . Melanoma in situ ([place]) 02/2007    right arm  . Pulmonary nodules 02/2007    2-66mm  . Diverticulosis of colon   . Heart murmur   . Arthritis      Social History   Social History  . Marital Status: Married    Spouse Name: N/A  . Number of Children: 2  . Years of Education: N/A   Occupational History  . retired     Primary school teacher   Social History Main Topics  . Smoking status: Never Smoker   . Smokeless tobacco: Never Used  . Alcohol Use: Yes     Comment: less than 1 drink of wine a week  . Drug Use: No  . Sexual Activity: Not on file   Other Topics Concern  . 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 Tonsillectomy    . Dilation and curettage of uterus  2002    uterine fibroids  . Colonoscopy      Family History  Problem Relation Age of Onset  . Cancer Father     lung  . Colon cancer Cousin     1st pat.cousin  . Rectal  cancer Neg Hx   . Stomach cancer Neg Hx     Allergies  Allergen Reactions  . Codeine Nausea And Vomiting    REACTION: Jittery    Current Outpatient Prescriptions on File Prior to Visit  Medication Sig Dispense Refill  . aspirin 81 MG tablet Take 81 mg by mouth daily.      Marland Kitchen atorvastatin (LIPITOR) 20 MG tablet TAKE ONE TABLET BY MOUTH ONCE DAILY 30 tablet 5  . Calcium-Vitamin D-Vitamin K [id number] MG-UNT-MCG TABS Take 1 tablet by mouth daily.    . fish oil-omega-3 fatty acids 1000 MG capsule Take 1 capsule by mouth daily.       No current facility-administered medications on file prior to visit.    BP 119/57 mmHg  Pulse 62  Temp(Src) 98 F (36.7 C) (Oral)  Resp 16  Ht 5\' 4"  (1.626 m)  Wt 139 lb 6.4 oz (63.231 kg)  BMI 23.92 kg/m2  SpO2 100%       Objective:   Physical Exam  Constitutional: She is oriented to  person, place, and time. She appears well-developed and well-nourished.  Musculoskeletal: She exhibits no edema.  Neurological: She is alert and oriented to person, place, and time.  Psychiatric: She has a normal mood and affect. Her behavior is normal. Judgment and thought content normal.          Assessment & Plan:  15 minutes spent with pt today. >50% of this time was spent counseling patient on anxiety/depression/treatment.

## 2015-11-25 NOTE — Progress Notes (Signed)
Pre visit review using our clinic review tool, if applicable. No additional management support is needed unless otherwise documented below in the visit note. 

## 2015-11-25 NOTE — Assessment & Plan Note (Signed)
Improved, tolerating lexapro. Advised pt to monitor weight and let me know if she has weight gain on this medication.

## 2015-11-30 ENCOUNTER — Other Ambulatory Visit: Payer: Self-pay | Admitting: Family

## 2016-01-04 DIAGNOSIS — D489 Neoplasm of uncertain behavior, unspecified: Secondary | ICD-10-CM | POA: Diagnosis not present

## 2016-01-04 DIAGNOSIS — D485 Neoplasm of uncertain behavior of skin: Secondary | ICD-10-CM | POA: Diagnosis not present

## 2016-01-04 DIAGNOSIS — Z85820 Personal history of malignant melanoma of skin: Secondary | ICD-10-CM | POA: Diagnosis not present

## 2016-01-04 DIAGNOSIS — D229 Melanocytic nevi, unspecified: Secondary | ICD-10-CM | POA: Diagnosis not present

## 2016-02-25 DIAGNOSIS — Z23 Encounter for immunization: Secondary | ICD-10-CM | POA: Diagnosis not present

## 2016-03-02 DIAGNOSIS — D2362 Other benign neoplasm of skin of left upper limb, including shoulder: Secondary | ICD-10-CM | POA: Diagnosis not present

## 2016-03-02 DIAGNOSIS — D2262 Melanocytic nevi of left upper limb, including shoulder: Secondary | ICD-10-CM | POA: Diagnosis not present

## 2016-03-29 ENCOUNTER — Encounter: Payer: Self-pay | Admitting: Family

## 2016-03-29 ENCOUNTER — Ambulatory Visit (INDEPENDENT_AMBULATORY_CARE_PROVIDER_SITE_OTHER): Payer: Medicare Other | Admitting: Family

## 2016-03-29 DIAGNOSIS — E785 Hyperlipidemia, unspecified: Secondary | ICD-10-CM | POA: Diagnosis not present

## 2016-03-29 DIAGNOSIS — F418 Other specified anxiety disorders: Secondary | ICD-10-CM

## 2016-03-29 DIAGNOSIS — F32A Depression, unspecified: Secondary | ICD-10-CM

## 2016-03-29 DIAGNOSIS — F419 Anxiety disorder, unspecified: Principal | ICD-10-CM

## 2016-03-29 DIAGNOSIS — F329 Major depressive disorder, single episode, unspecified: Secondary | ICD-10-CM

## 2016-03-29 MED ORDER — ESCITALOPRAM OXALATE 10 MG PO TABS: 10.0000 mg | ORAL_TABLET | Freq: Every day | ORAL | 1 refills | Status: DC

## 2016-03-29 NOTE — Progress Notes (Signed)
Pre visit review using our clinic review tool, if applicable. No additional management support is needed unless otherwise documented below in the visit note. 

## 2016-03-29 NOTE — Progress Notes (Signed)
Subjective:    Patient ID: Samantha Landry, female    DOB: 1945/10/22, 70 y.o.   MRN: [id number]  HPI  Samantha Landry is a 71 yr old female who presents today for follow  Up of her anxiety and depression. Reports that she is less up tight about things. Denies side effects.    Wt Readings from Last 3 Encounters:  03/29/16 140 lb 12.8 oz (63.9 kg)  11/25/15 139 lb 6.4 oz (63.2 kg)  10/19/15 137 lb 12.8 oz (62.5 kg)    Hyperlipidemia- Continues lipitor- denies myalgia.  Lab Results  Component Value Date   CHOL 145 10/19/2015   HDL 44.70 10/19/2015   LDLCALC 80 10/19/2015   LDLDIRECT 142.9 04/04/2007   TRIG 101.0 10/19/2015   CHOLHDL 3 10/19/2015      Review of Systems Past Medical History:  Diagnosis Date  . Arthritis   . Diverticulosis of colon   . Heart murmur   . Melanoma in situ ([place]) 02/2007   right arm  . Pulmonary nodules 02/2007   2-25mm     Social History   Social History  . Marital status: Married    Spouse name: N/A  . Number of children: 2  . Years of education: N/A   Occupational History  . retired     Primary school teacher   Social History Main Topics  . Smoking status: Never Smoker  . Smokeless tobacco: Never Used  . Alcohol use Yes     Comment: less than 1 drink of wine a week  . Drug use: No  . Sexual activity: Not on file   Other Topics Concern  . 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 COLONOSCOPY    . DILATION AND CURETTAGE OF UTERUS  2002   uterine fibroids  . TONSILLECTOMY      Family History  Problem Relation Age of Onset  . Cancer Father     lung  . Colon cancer Cousin     1st pat.cousin  . Rectal cancer Neg Hx   . Stomach cancer Neg Hx     Allergies  Allergen Reactions  . Codeine Nausea And Vomiting    REACTION: Jittery    Current  Outpatient Prescriptions on File Prior to Visit  Medication Sig Dispense Refill  . aspirin 81 MG tablet Take 81 mg by mouth daily.      Marland Kitchen atorvastatin (LIPITOR) 20 MG tablet TAKE ONE TABLET BY MOUTH ONCE DAILY 30 tablet 5  . Calcium-Vitamin D-Vitamin K [id number] MG-UNT-MCG TABS Take 1 tablet by mouth daily.    . fish oil-omega-3 fatty acids 1000 MG capsule Take 1 capsule by mouth daily.       No current facility-administered medications on file prior to visit.     BP 126/75 (BP Location: Right Arm, Cuff Size: Normal)   Pulse (!) 59   Resp 16   Ht 5\' 4"  (1.626 m)   Wt 140 lb 12.8 oz (63.9 kg)   SpO2 98% Comment: room air  BMI 24.17 kg/m       Objective:   Physical Exam  Constitutional: She appears well-developed and well-nourished.  Cardiovascular: Normal rate, regular rhythm and normal heart sounds.   No murmur heard. Pulmonary/Chest: Effort normal and  breath sounds normal. No respiratory distress. She has no wheezes.  Psychiatric: She has a normal mood and affect. Her behavior is normal. Judgment and thought content normal.          Assessment & Plan:

## 2016-03-29 NOTE — Assessment & Plan Note (Signed)
Stable on lexapro.  Continue same . 

## 2016-03-29 NOTE — Assessment & Plan Note (Signed)
Stable on statin, continue same.  

## 2016-04-25 ENCOUNTER — Encounter: Payer: Self-pay | Admitting: Family

## 2016-04-25 DIAGNOSIS — N6002 Solitary cyst of left breast: Secondary | ICD-10-CM | POA: Diagnosis not present

## 2016-05-09 ENCOUNTER — Other Ambulatory Visit: Payer: Self-pay | Admitting: Family

## 2016-05-09 NOTE — Telephone Encounter (Signed)
Refill sent per [facility] refill protocol/SLS  

## 2016-05-10 DIAGNOSIS — M19072 Primary osteoarthritis, left ankle and foot: Secondary | ICD-10-CM | POA: Diagnosis not present

## 2016-09-21 IMAGING — CR DG CHEST 2V
2 series · 2 of 2 positions shown · non-contrast
Comparison: None.

CLINICAL DATA: History of lung nodules, followup

EXAM:
CHEST  2 VIEW

[w chest pa]
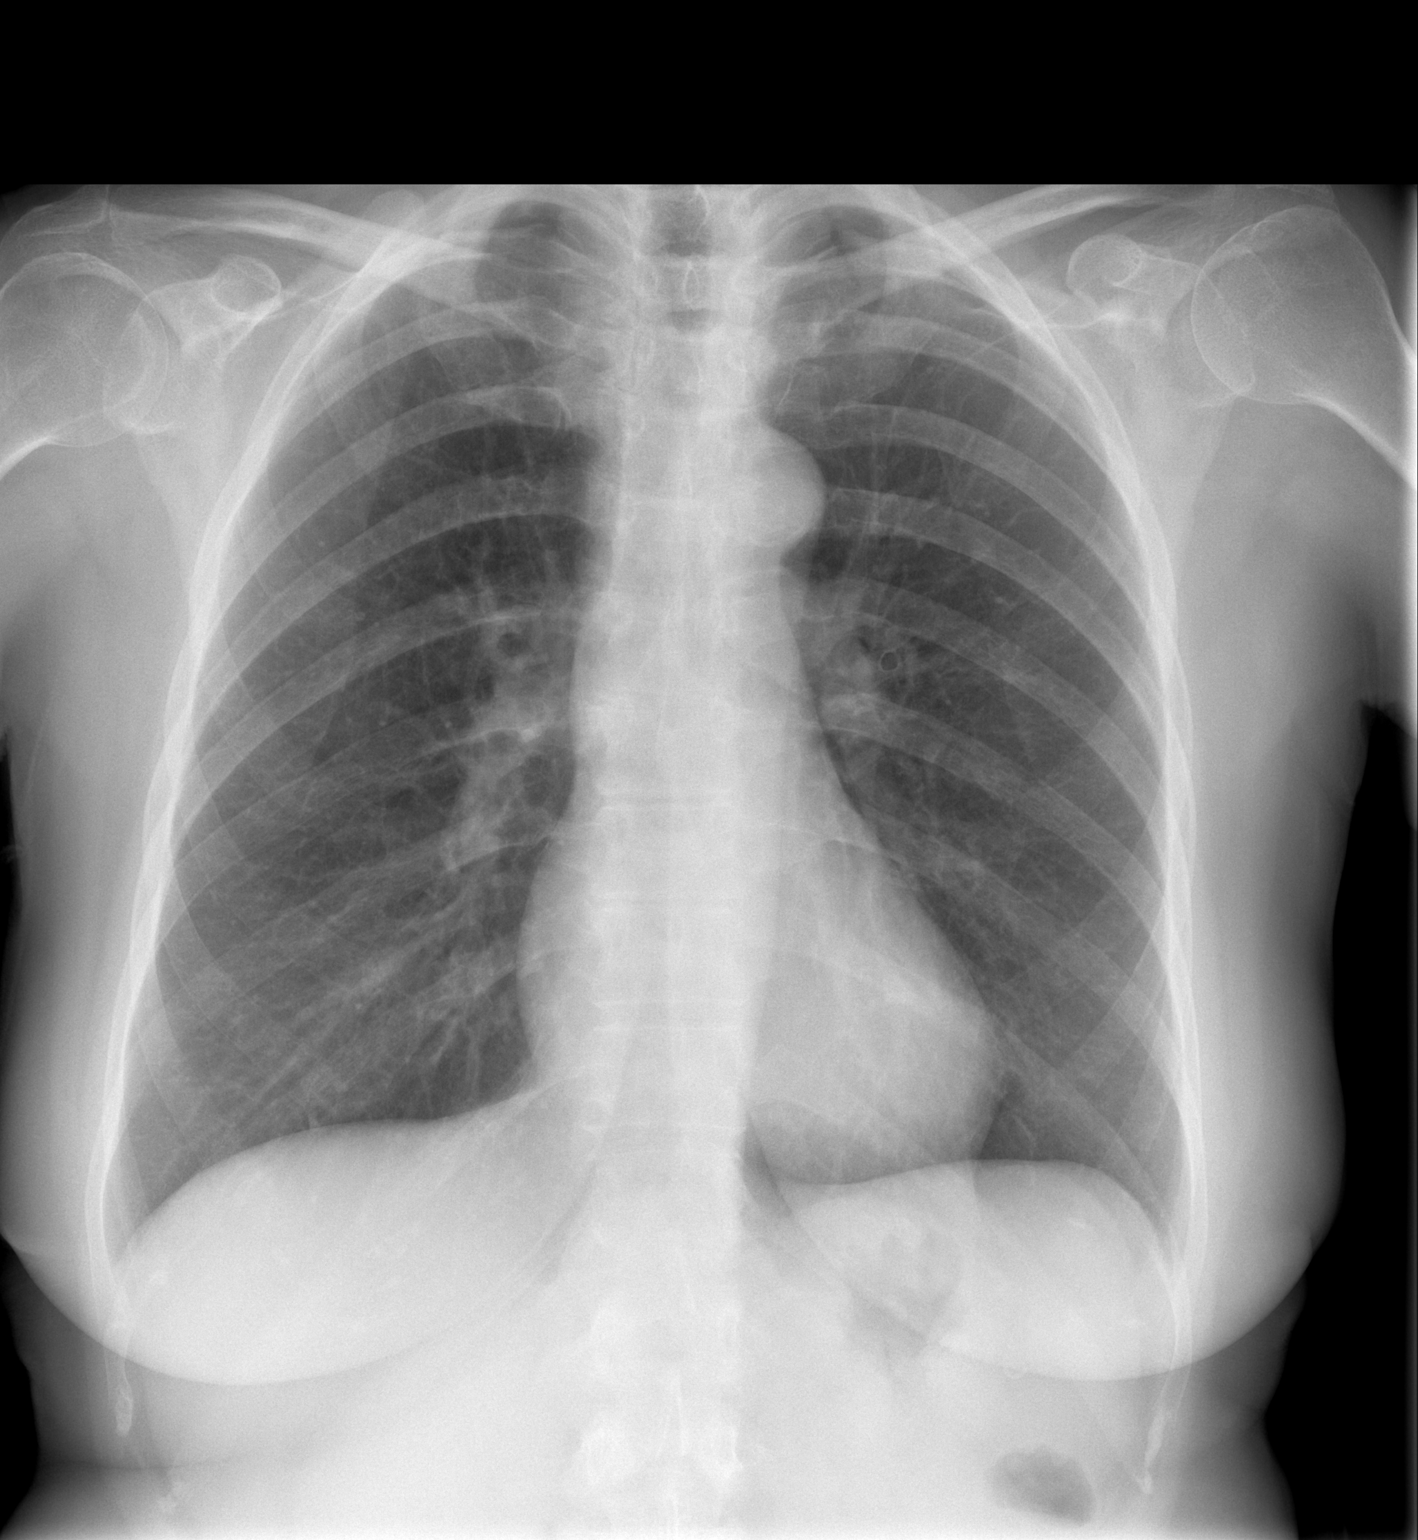

[w chest lat]
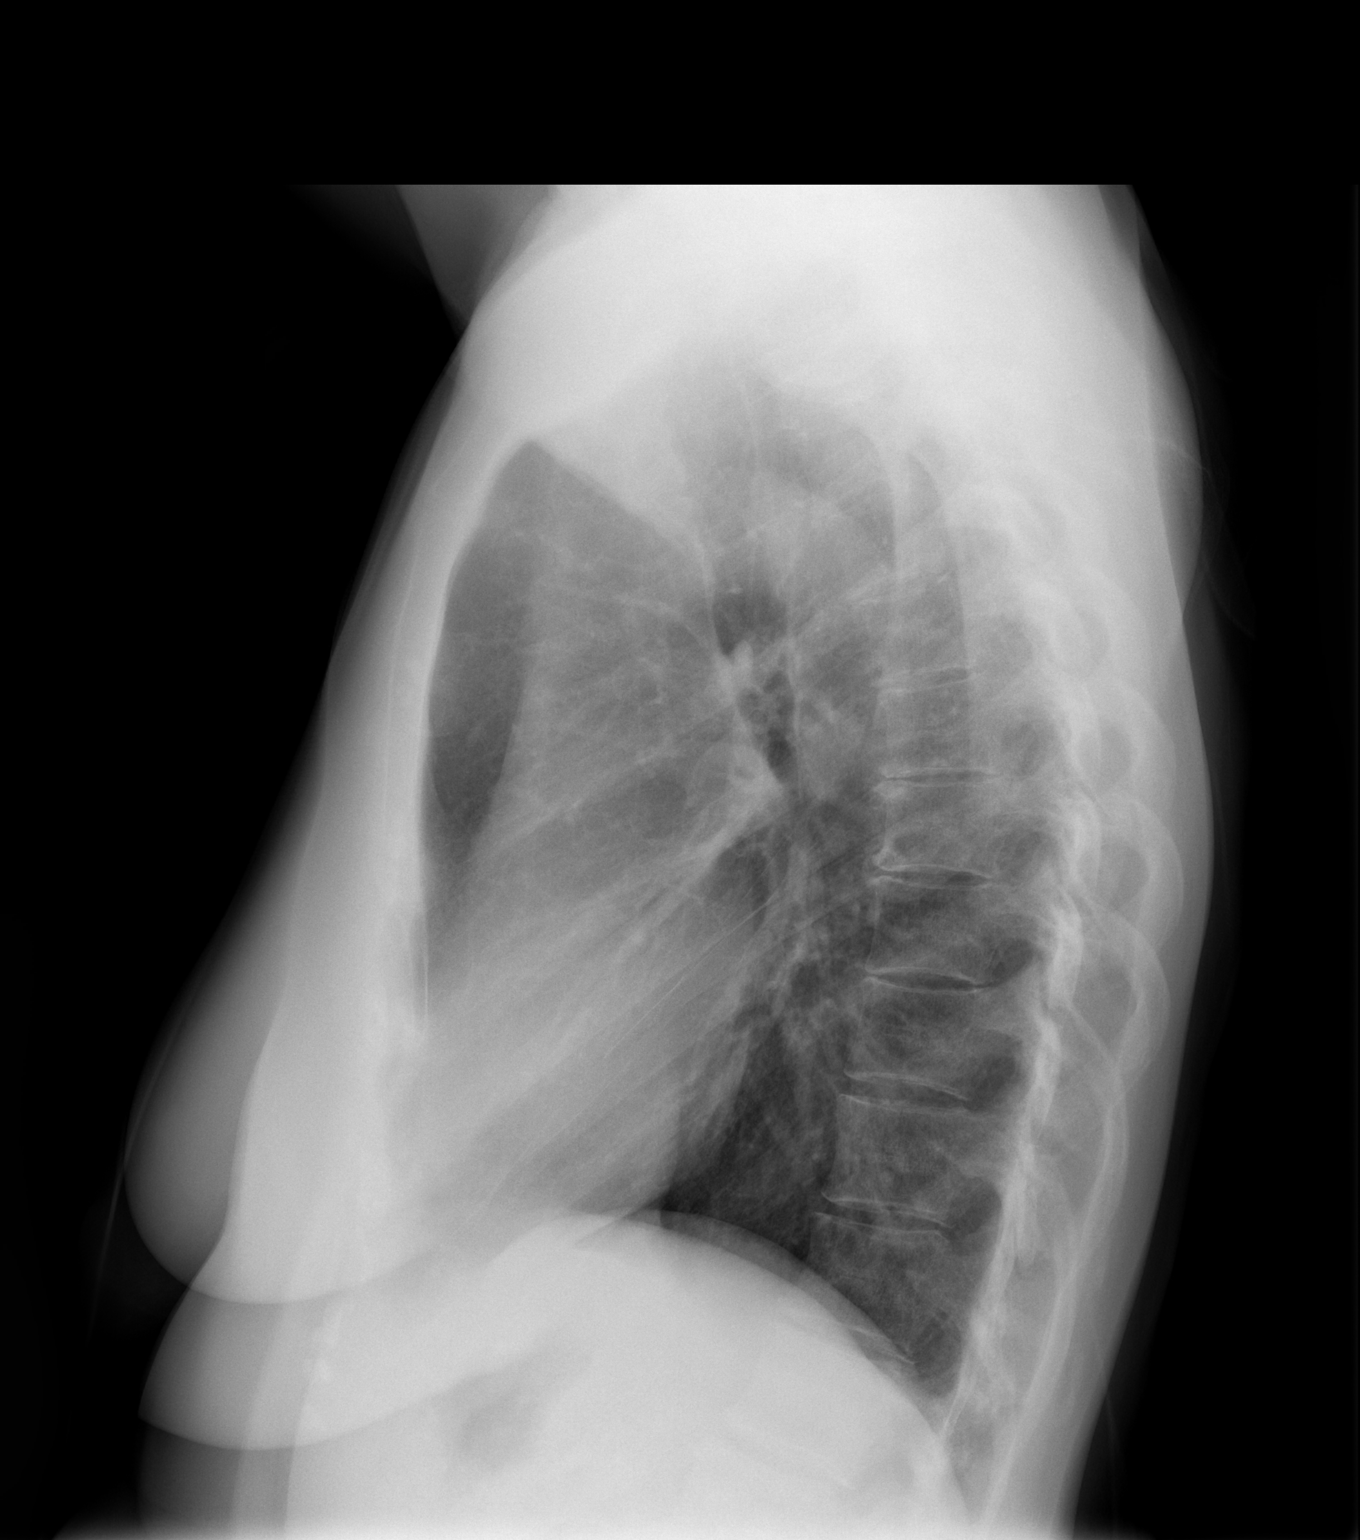

[2 of 2 positions shown; findings below may reference images not displayed]

FINDINGS: Small lung nodules noted previously in both upper mid lungs are
completely stable and most consistent with a benign process, being
stable at least since 3008. No infiltrate or effusion is seen.
Mediastinal and hilar contours are unremarkable. The heart is within
normal limits in size. No acute bony abnormality is seen.
IMPRESSION: Stable small lung nodules noted previously consistent with a benign
process. No definite active process.

## 2016-10-22 ENCOUNTER — Encounter: Payer: Self-pay | Admitting: Family

## 2016-10-24 ENCOUNTER — Encounter: Payer: Self-pay | Admitting: Family

## 2016-10-24 DIAGNOSIS — N6012 Diffuse cystic mastopathy of left breast: Secondary | ICD-10-CM | POA: Diagnosis not present

## 2016-10-24 DIAGNOSIS — M85851 Other specified disorders of bone density and structure, right thigh: Secondary | ICD-10-CM | POA: Diagnosis not present

## 2016-10-27 ENCOUNTER — Ambulatory Visit: Payer: Self-pay | Admitting: Family

## 2016-10-27 ENCOUNTER — Telehealth: Payer: Self-pay | Admitting: *Deleted

## 2016-10-27 NOTE — Telephone Encounter (Signed)
Pt agrees to AWV with RN @815  and follow up with PCP @9  on 11/01/16

## 2016-10-31 ENCOUNTER — Encounter: Payer: Self-pay | Admitting: Family

## 2016-10-31 ENCOUNTER — Telehealth: Payer: Self-pay | Admitting: Family

## 2016-10-31 ENCOUNTER — Ambulatory Visit (INDEPENDENT_AMBULATORY_CARE_PROVIDER_SITE_OTHER): Payer: Medicare Other | Admitting: Family

## 2016-10-31 VITALS — BP 125/51 | HR 68 | Temp 98.2°F | Resp 16 | Ht 62.0 in | Wt 141.2 lb

## 2016-10-31 DIAGNOSIS — E785 Hyperlipidemia, unspecified: Secondary | ICD-10-CM

## 2016-10-31 DIAGNOSIS — F329 Major depressive disorder, single episode, unspecified: Secondary | ICD-10-CM | POA: Diagnosis not present

## 2016-10-31 DIAGNOSIS — R21 Rash and other nonspecific skin eruption: Secondary | ICD-10-CM

## 2016-10-31 DIAGNOSIS — F419 Anxiety disorder, unspecified: Secondary | ICD-10-CM

## 2016-10-31 DIAGNOSIS — F32A Depression, unspecified: Secondary | ICD-10-CM

## 2016-10-31 DIAGNOSIS — R5383 Other fatigue: Secondary | ICD-10-CM

## 2016-10-31 LAB — CBC WITH DIFFERENTIAL/PLATELET
BASOS PCT: 0.8 % (ref 0.0–3.0)
Basophils Absolute: 0 10*3/uL (ref 0.0–0.1)
EOS PCT: 1.4 % (ref 0.0–5.0)
Eosinophils Absolute: 0.1 10*3/uL (ref 0.0–0.7)
HCT: 41.9 % (ref 36.0–46.0)
Hemoglobin: 13.9 g/dL (ref 12.0–15.0)
LYMPHS ABS: 2 10*3/uL (ref 0.7–4.0)
Lymphocytes Relative: 30.5 % (ref 12.0–46.0)
MCHC: 33.3 g/dL (ref 30.0–36.0)
MCV: 92.5 fl (ref 78.0–100.0)
MONOS PCT: 8.1 % (ref 3.0–12.0)
Monocytes Absolute: 0.5 10*3/uL (ref 0.1–1.0)
NEUTROS ABS: 3.8 10*3/uL (ref 1.4–7.7)
NEUTROS PCT: 59.2 % (ref 43.0–77.0)
PLATELETS: 231 10*3/uL (ref 150.0–400.0)
RBC: 4.52 Mil/uL (ref 3.87–5.11)
RDW: 13.1 % (ref 11.5–15.5)
WBC: 6.5 10*3/uL (ref 4.0–10.5)

## 2016-10-31 LAB — LIPID PANEL
Cholesterol: 138 mg/dL (ref 0–200)
HDL: 47.4 mg/dL (ref >39.00)
LDL Cholesterol: 57 mg/dL (ref 0–99)
NONHDL: 90.82
Total CHOL/HDL Ratio: 3
Triglycerides: 169 mg/dL — ABNORMAL HIGH (ref 0.0–149.0)
VLDL: 33.8 mg/dL (ref 0.0–40.0)

## 2016-10-31 LAB — TSH: TSH: 0.67 u[IU]/mL (ref 0.35–4.50)

## 2016-10-31 MED ORDER — ATORVASTATIN CALCIUM 20 MG PO TABS: 20.0000 mg | ORAL_TABLET | Freq: Every day | ORAL | 1 refills | Status: DC

## 2016-10-31 MED ORDER — ESCITALOPRAM OXALATE 10 MG PO TABS: 10.0000 mg | ORAL_TABLET | Freq: Every day | ORAL | 1 refills | Status: DC

## 2016-10-31 NOTE — Patient Instructions (Addendum)
Please complete lab work prior to leaving. We will contact you with the results.

## 2016-10-31 NOTE — Progress Notes (Signed)
Subjective:   Samantha Landry is a 71 y.o. female who presents for Medicare Annual (Subsequent) preventive examination.  Review of Systems:  No ROS.  Medicare Wellness Visit. Cardiac Risk Factors include: advanced age (>65men, >43 women);dyslipidemia Sleep patterns:  Wakes occasionally to urinate and often has trouble falling back asleep. Naps rarely. Feeling tired the last couple of weeks. Melatonin discussed as an option. Home Safety/Smoke Alarms:  Feels safe in home. Smoke alarms in place.  Living environment; residence and Firearm Safety: Lives with husband. Guns safely stored. No stairs. Seat Belt Safety/Bike Helmet: Wears seat belt.   Counseling:   Eye Exam- Dr.Shapiro. Next appt in June. Wears glasses. Dental-Dr.Burleson every 6 months.  Female:   Pap-   Last 10/19/15:  NEGATIVE FOR INTRAEPITHELIAL LESIONS OR MALIGNANCY.  Mammo-   Last 04/25/16:  BI-RADS Category 2: Benign Dexa scan-  Last 09/10/16: osteopenia.    CCS- Last 04/23/12: pre-cancerous polyp removed. Recall 5 yrs per report letter.     Objective:     Vitals: BP 110/62 (BP Location: Right Arm, Patient Position: Sitting, Cuff Size: Normal)   Pulse 63   Ht 5\' 4"  (1.626 m)   Wt 141 lb (64 kg)   SpO2 98%   BMI 24.20 kg/m   Body mass index is 24.2 kg/m.   Tobacco History  Smoking Status  . Never Smoker  Smokeless Tobacco  . Never Used     Counseling given: Not Answered   Past Medical History:  Diagnosis Date  . Arthritis   . Diverticulosis of colon   . Heart murmur   . Melanoma in situ ([place]) 02/2007   right arm  . Pulmonary nodules 02/2007   2-31mm   Past Surgical History:  Procedure Laterality Date  . COLONOSCOPY    . DILATION AND CURETTAGE OF UTERUS  2002   uterine fibroids  . TONSILLECTOMY     Family History  Problem Relation Age of Onset  . Cancer Father        lung  . Colon cancer Cousin        1st pat.cousin  . Rectal cancer Neg Hx   . Stomach cancer Neg Hx    History  Sexual  Activity  . Sexual activity: No    Outpatient Encounter Prescriptions as of 11/01/2016  Medication Sig  . aspirin 81 MG tablet Take 81 mg by mouth daily.    Marland Kitchen atorvastatin (LIPITOR) 20 MG tablet Take 1 tablet (20 mg total) by mouth daily.  . Calcium-Vitamin D-Vitamin K 846-962-95 MG-UNT-MCG TABS Take 1 tablet by mouth daily.  Marland Kitchen escitalopram (LEXAPRO) 10 MG tablet Take 1 tablet (10 mg total) by mouth daily.  . fish oil-omega-3 fatty acids 1000 MG capsule Take 1 capsule by mouth daily.    . RaNITidine HCl (ZANTAC PO) Take by mouth as needed.   No facility-administered encounter medications on file as of 11/01/2016.     Activities of Daily Living In your present state of health, do you have any difficulty performing the following activities: 11/01/2016  Hearing? N  Vision? N  Difficulty concentrating or making decisions? N  Walking or climbing stairs? N  Dressing or bathing? N  Doing errands, shopping? N  Preparing Food and eating ? N  Using the Toilet? N  In the past six months, have you accidently leaked urine? N  Do you have problems with loss of bowel control? N  Managing your Medications? N  Managing your Finances? N  Housekeeping or managing  your Housekeeping? N  Some recent data might be hidden    Patient Care Team: Debbrah Alar, NP as PCP - General (Internal Medicine) Delila Pereyra, MD as Consulting Physician (Gynecology)    Assessment:    Physical assessment deferred to PCP.  Exercise Activities and Dietary recommendations Current Exercise Habits: Home exercise routine, Type of exercise: walking, Time (Minutes): > 60, Intensity: Mild   Diet (meal preparation, eat out, water intake, caffeinated beverages, dairy products, fruits and vegetables): in general, a "healthy" diet        Goals      Patient Stated   . Continue exercising (pt-stated)      Other   . Weight (lb) < 135 lb (61.2 kg)          With exercise and healthy diet.      Fall Risk Fall  Risk  11/01/2016 10/31/2016 10/19/2015 10/16/2014 10/16/2014  Falls in the past year? No No No No No   Depression Screen PHQ 2/9 Scores 11/01/2016 10/31/2016 03/29/2016 10/19/2015  PHQ - 2 Score 0 1 0 1  PHQ- 9 Score - 6 - -     Cognitive Function Ad8 score reviewed for issues:  Issues making decisions:no  Less interest in hobbies / activities:no  Repeats questions, stories (family complaining):no  Trouble using ordinary gadgets (microwave, computer, phone):no  Forgets the month or year: no  Mismanaging finances: no  Remembering appts:no  Daily problems with thinking and/or memory:no Ad8 score is=0         Immunization History  Administered Date(s) Administered  . Influenza Split 03/05/2012  . Influenza Whole 03/22/2010  . Influenza, High Dose Seasonal PF 02/23/2015, 02/25/2016  . Pneumococcal Conjugate-13 09/22/2013  . Pneumococcal Polysaccharide-23 03/05/2012  . Tdap 10/26/2010  . Zoster 10/26/2010   Screening Tests Health Maintenance  Topic Date Due  . Hepatitis C Screening  09/21/1945  . INFLUENZA VACCINE  01/03/2017  . COLONOSCOPY  04/23/2017  . MAMMOGRAM  10/04/2017  . TETANUS/TDAP  10/25/2020  . DEXA SCAN  Completed  . PNA vac Low Risk Adult  Completed      Plan:   Follow up with PCP as directed.  Continue to eat heart healthy diet (full of fruits, vegetables, whole grains, lean protein, water--limit salt, fat, and sugar intake) and increase physical activity as tolerated.  Continue doing brain stimulating activities (puzzles, reading, adult coloring books, staying active) to keep memory sharp.   I have personally reviewed and noted the following in the patient's chart:   . Medical and social history . Use of alcohol, tobacco or illicit drugs  . Current medications and supplements . Functional ability and status . Nutritional status . Physical activity . Advanced directives . List of other physicians . Hospitalizations, surgeries, and ER visits  in previous 12 months . Vitals . Screenings to include cognitive, depression, and falls . Referrals and appointments  In addition, I have reviewed and discussed with patient certain preventive protocols, quality metrics, and best practice recommendations. A written personalized care plan for preventive services as well as general preventive health recommendations were provided to patient.     Shela Nevin, [place]  11/01/2016

## 2016-10-31 NOTE — Addendum Note (Signed)
Addended by: Caffie Pinto on: 10/31/2016 11:56 AM   Modules accepted: Orders

## 2016-10-31 NOTE — Progress Notes (Signed)
Subjective:    Patient ID: Samantha Landry, female    DOB: 09/25/1945, 71 y.o.   MRN: [id number]  HPI  Samantha Landry is a 71 yr old female who presents today to discuss tick bite. Tick bite occurred initially back in February and was located on her upper thigh.    2 weeks ago she developed a rash across the right upper thigh. Reports that rash was "totally red." resolved over the weekend.  Notes that she has had multiple tick bites.  Denies joint pain, headache/fever.  She does note mild fatigue.   Depression- continues lexapro 10mg . Notes that overall mood has been good and she has been able to handle stress well.   Hyperlipidemia- continues low cholesterol diet.    Review of Systems See HPI  Past Medical History:  Diagnosis Date  . Arthritis   . Diverticulosis of colon   . Heart murmur   . Melanoma in situ ([place]) 02/2007   right arm  . Pulmonary nodules 02/2007   2-88mm     Social History   Social History  . Marital status: Married    Spouse name: N/A  . Number of children: 2  . Years of education: N/A   Occupational History  . retired     Primary school teacher   Social History Main Topics  . Smoking status: Never Smoker  . Smokeless tobacco: Never Used  . Alcohol use Yes     Comment: less than 1 drink of wine a week  . Drug use: No  . Sexual activity: Not on file   Other Topics Concern  . 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 COLONOSCOPY    . DILATION AND CURETTAGE OF UTERUS  2002   uterine fibroids  . TONSILLECTOMY      Family History  Problem Relation Age of Onset  . Cancer Father        lung  . Colon cancer Cousin        1st pat.cousin  . Rectal cancer Neg Hx   . Stomach cancer Neg Hx     Allergies  Allergen Reactions  . Codeine Nausea And Vomiting    REACTION:  Jittery    Current Outpatient Prescriptions on File Prior to Visit  Medication Sig Dispense Refill  . aspirin 81 MG tablet Take 81 mg by mouth daily.      Marland Kitchen atorvastatin (LIPITOR) 20 MG tablet TAKE ONE TABLET BY MOUTH ONCE DAILY 90 tablet 1  . Calcium-Vitamin D-Vitamin K 932-355-73 MG-UNT-MCG TABS Take 1 tablet by mouth daily.    Marland Kitchen escitalopram (LEXAPRO) 10 MG tablet Take 1 tablet (10 mg total) by mouth daily. 90 tablet 1  . fish oil-omega-3 fatty acids 1000 MG capsule Take 1 capsule by mouth daily.       No current facility-administered medications on file prior to visit.     BP (!) 125/51 (BP Location: Right Arm, Cuff Size: Normal)   Pulse 68   Temp 98.2 F (36.8 C) (Oral)   Resp 16   Ht 5\' 2"  (1.575 m)   Wt 141 lb 3.2 oz (64 kg)   SpO2 99%   BMI 25.83 kg/m       Objective:   Physical Exam  Constitutional: She appears well-developed and well-nourished.  Cardiovascular: Normal rate, regular rhythm and normal heart sounds.   No murmur heard. Pulmonary/Chest: Effort normal and breath sounds normal. No respiratory distress. She has no wheezes.  Skin: No rash noted.  Psychiatric: She has a normal mood and affect. Her behavior is normal. Judgment and thought content normal.          Assessment & Plan:  Hx Skin rash- resolved in the setting of recent tick bite.  Will check RMSF and Lyme testing.   Fatigue- obtain TSH, CBC.

## 2016-10-31 NOTE — Telephone Encounter (Signed)
See mychart.  

## 2016-10-31 NOTE — Assessment & Plan Note (Signed)
Stable on lexapro, continue same.  

## 2016-10-31 NOTE — Assessment & Plan Note (Signed)
Stable on statin.  Lab Results  Component Value Date   CHOL 145 10/19/2015   HDL 44.70 10/19/2015   LDLCALC 80 10/19/2015   LDLDIRECT 142.9 04/04/2007   TRIG 101.0 10/19/2015   CHOLHDL 3 10/19/2015   Continue same.

## 2016-11-01 ENCOUNTER — Encounter: Payer: Self-pay | Admitting: *Deleted

## 2016-11-01 ENCOUNTER — Ambulatory Visit (INDEPENDENT_AMBULATORY_CARE_PROVIDER_SITE_OTHER): Payer: Medicare Other | Admitting: *Deleted

## 2016-11-01 ENCOUNTER — Encounter: Payer: Medicare Other | Admitting: Family

## 2016-11-01 VITALS — BP 110/62 | HR 63 | Ht 64.0 in | Wt 141.0 lb

## 2016-11-01 DIAGNOSIS — Z0000 Encounter for general adult medical examination without abnormal findings: Secondary | ICD-10-CM | POA: Diagnosis not present

## 2016-11-01 LAB — LYME AB/WESTERN BLOT REFLEX

## 2016-11-01 LAB — ROCKY MTN SPOTTED FVR ABS PNL(IGG+IGM)
RMSF IGM: NOT DETECTED
RMSF IgG: DETECTED — AB

## 2016-11-01 LAB — REFLEX RMSF IGG TITER: RMSF IgG Titer: 1:128 {titer} — ABNORMAL HIGH

## 2016-11-01 NOTE — Patient Instructions (Signed)
Samantha Landry , Thank you for taking time to come for your Medicare Wellness Visit. I appreciate your ongoing commitment to your health goals. Please review the following plan we discussed and let me know if I can assist you in the future.   These are the goals we discussed: Goals      Patient Stated   . Continue exercising (pt-stated)      Other   . Weight (lb) < 135 lb (61.2 kg)          With exercise and healthy diet.       This is a list of the screening recommended for you and due dates:  Health Maintenance  Topic Date Due  .  Hepatitis C: One time screening is recommended by Center for Disease Control  (CDC) for  adults born from [birth year] through 1964.   1945-10-03  . Flu Shot  01/03/2017  . Colon Cancer Screening  04/23/2017  . Mammogram  10/04/2017  . Tetanus Vaccine  10/25/2020  . DEXA scan (bone density measurement)  Completed  . Pneumonia vaccines  Completed   Continue to eat heart healthy diet (full of fruits, vegetables, whole grains, lean protein, water--limit salt, fat, and sugar intake) and increase physical activity as tolerated.  Continue doing brain stimulating activities (puzzles, reading, adult coloring books, staying active) to keep memory sharp.    Health Maintenance for Postmenopausal Women Menopause is a normal process in which your reproductive ability comes to an end. This process happens gradually over a span of months to years, usually between the ages of 78 and 59. Menopause is complete when you have missed 12 consecutive menstrual periods. It is important to talk with your health care provider about some of the most common conditions that affect postmenopausal women, such as heart disease, cancer, and bone loss (osteoporosis). Adopting a healthy lifestyle and getting preventive care can help to promote your health and wellness. Those actions can also lower your chances of developing some of these common conditions. What should I know about menopause? During  menopause, you may experience a number of symptoms, such as:  Moderate-to-severe hot flashes.  Night sweats.  Decrease in sex drive.  Mood swings.  Headaches.  Tiredness.  Irritability.  Memory problems.  Insomnia. Choosing to treat or not to treat menopausal changes is an individual decision that you make with your health care provider. What should I know about hormone replacement therapy and supplements? Hormone therapy products are effective for treating symptoms that are associated with menopause, such as hot flashes and night sweats. Hormone replacement carries certain risks, especially as you become older. If you are thinking about using estrogen or estrogen with progestin treatments, discuss the benefits and risks with your health care provider. What should I know about heart disease and stroke? Heart disease, heart attack, and stroke become more likely as you age. This may be due, in part, to the hormonal changes that your body experiences during menopause. These can affect how your body processes dietary fats, triglycerides, and cholesterol. Heart attack and stroke are both medical emergencies. There are many things that you can do to help prevent heart disease and stroke:  Have your blood pressure checked at least every 1-2 years. High blood pressure causes heart disease and increases the risk of stroke.  If you are 55-45 years old, ask your health care provider if you should take aspirin to prevent a heart attack or a stroke.  Do not use any tobacco products, including  cigarettes, chewing tobacco, or electronic cigarettes. If you need help quitting, ask your health care provider.  It is important to eat a healthy diet and maintain a healthy weight.  Be sure to include plenty of vegetables, fruits, low-fat dairy products, and lean protein.  Avoid eating foods that are high in solid fats, added sugars, or salt (sodium).  Get regular exercise. This is one of the most  important things that you can do for your health.  Try to exercise for at least 150 minutes each week. The type of exercise that you do should increase your heart rate and make you sweat. This is known as moderate-intensity exercise.  Try to do strengthening exercises at least twice each week. Do these in addition to the moderate-intensity exercise.  Know your numbers.Ask your health care provider to check your cholesterol and your blood glucose. Continue to have your blood tested as directed by your health care provider. What should I know about cancer screening? There are several types of cancer. Take the following steps to reduce your risk and to catch any cancer development as early as possible. Breast Cancer  Practice breast self-awareness.  This means understanding how your breasts normally appear and feel.  It also means doing regular breast self-exams. Let your health care provider know about any changes, no matter how small.  If you are 40 or older, have a clinician do a breast exam (clinical breast exam or CBE) every year. Depending on your age, family history, and medical history, it may be recommended that you also have a yearly breast X-ray (mammogram).  If you have a family history of breast cancer, talk with your health care provider about genetic screening.  If you are at high risk for breast cancer, talk with your health care provider about having an MRI and a mammogram every year.  Breast cancer (BRCA) gene test is recommended for women who have family members with BRCA-related cancers. Results of the assessment will determine the need for genetic counseling and BRCA1 and for BRCA2 testing. BRCA-related cancers include these types:  Breast. This occurs in males or females.  Ovarian.  Tubal. This may also be called fallopian tube cancer.  Cancer of the abdominal or pelvic lining (peritoneal cancer).  Prostate.  Pancreatic. Cervical, Uterine, and Ovarian Cancer    Your health care provider may recommend that you be screened regularly for cancer of the pelvic organs. These include your ovaries, uterus, and vagina. This screening involves a pelvic exam, which includes checking for microscopic changes to the surface of your cervix (Pap test).  For women ages 21-65, health care providers may recommend a pelvic exam and a Pap test every three years. For women ages 30-65, they may recommend the Pap test and pelvic exam, combined with testing for human papilloma virus (HPV), every five years. Some types of HPV increase your risk of cervical cancer. Testing for HPV may also be done on women of any age who have unclear Pap test results.  Other health care providers may not recommend any screening for nonpregnant women who are considered low risk for pelvic cancer and have no symptoms. Ask your health care provider if a screening pelvic exam is right for you.  If you have had past treatment for cervical cancer or a condition that could lead to cancer, you need Pap tests and screening for cancer for at least 20 years after your treatment. If Pap tests have been discontinued for you, your risk factors (such as   having a new sexual partner) need to be reassessed to determine if you should start having screenings again. Some women have medical problems that increase the chance of getting cervical cancer. In these cases, your health care provider may recommend that you have screening and Pap tests more often.  If you have a family history of uterine cancer or ovarian cancer, talk with your health care provider about genetic screening.  If you have vaginal bleeding after reaching menopause, tell your health care provider.  There are currently no reliable tests available to screen for ovarian cancer. Lung Cancer  Lung cancer screening is recommended for adults 55-80 years old who are at high risk for lung cancer because of a history of smoking. A yearly low-dose CT scan of the  lungs is recommended if you:  Currently smoke.  Have a history of at least 30 pack-years of smoking and you currently smoke or have quit within the past 15 years. A pack-year is smoking an average of one pack of cigarettes per day for one year. Yearly screening should:  Continue until it has been 15 years since you quit.  Stop if you develop a health problem that would prevent you from having lung cancer treatment. Colorectal Cancer  This type of cancer can be detected and can often be prevented.  Routine colorectal cancer screening usually begins at age 50 and continues through age 75.  If you have risk factors for colon cancer, your health care provider may recommend that you be screened at an earlier age.  If you have a family history of colorectal cancer, talk with your health care provider about genetic screening.  Your health care provider may also recommend using home test kits to check for hidden blood in your stool.  A small camera at the end of a tube can be used to examine your colon directly (sigmoidoscopy or colonoscopy). This is done to check for the earliest forms of colorectal cancer.  Direct examination of the colon should be repeated every 5-10 years until age 75. However, if early forms of precancerous polyps or small growths are found or if you have a family history or genetic risk for colorectal cancer, you may need to be screened more often. Skin Cancer  Check your skin from head to toe regularly.  Monitor any moles. Be sure to tell your health care provider:  About any new moles or changes in moles, especially if there is a change in a mole's shape or color.  If you have a mole that is larger than the size of a pencil eraser.  If any of your family members has a history of skin cancer, especially at a young age, talk with your health care provider about genetic screening.  Always use sunscreen. Apply sunscreen liberally and repeatedly throughout the  day.  Whenever you are outside, protect yourself by wearing long sleeves, pants, a wide-brimmed hat, and sunglasses. What should I know about osteoporosis? Osteoporosis is a condition in which bone destruction happens more quickly than new bone creation. After menopause, you may be at an increased risk for osteoporosis. To help prevent osteoporosis or the bone fractures that can happen because of osteoporosis, the following is recommended:  If you are 19-50 years old, get at least 1,000 mg of calcium and at least 600 mg of vitamin D per day.  If you are older than age 50 but younger than age 70, get at least 1,200 mg of calcium and at least 600 mg   of vitamin D per day.  If you are older than age 46, get at least 1,200 mg of calcium and at least 800 mg of vitamin D per day. Smoking and excessive alcohol intake increase the risk of osteoporosis. Eat foods that are rich in calcium and vitamin D, and do weight-bearing exercises several times each week as directed by your health care provider. What should I know about how menopause affects my mental health? Depression may occur at any age, but it is more common as you become older. Common symptoms of depression include:  Low or sad mood.  Changes in sleep patterns.  Changes in appetite or eating patterns.  Feeling an overall lack of motivation or enjoyment of activities that you previously enjoyed.  Frequent crying spells. Talk with your health care provider if you think that you are experiencing depression. What should I know about immunizations? It is important that you get and maintain your immunizations. These include:  Tetanus, diphtheria, and pertussis (Tdap) booster vaccine.  Influenza every year before the flu season begins.  Pneumonia vaccine.  Shingles vaccine. Your health care provider may also recommend other immunizations. This information is not intended to replace advice given to you by your health care provider. Make  sure you discuss any questions you have with your health care provider. Document Released: 07/14/2005 Document Revised: 12/10/2015 Document Reviewed: 02/23/2015 Elsevier Interactive Patient Education  2017 [employer].

## 2016-11-01 NOTE — Addendum Note (Signed)
Addended by: Debbrah Alar on: 11/01/2016 09:21 AM   Modules accepted: Level of Service

## 2016-11-01 NOTE — Progress Notes (Signed)
Reviewed and agree.

## 2016-11-03 ENCOUNTER — Telehealth: Payer: Self-pay | Admitting: Family

## 2016-11-03 MED ORDER — DOXYCYCLINE HYCLATE 100 MG PO TABS: 100.0000 mg | ORAL_TABLET | Freq: Two times a day (BID) | ORAL | 0 refills | Status: DC

## 2016-11-03 NOTE — Telephone Encounter (Signed)
Please contact patient and let her know that her lyme testing is negative but [facility] Spotted fever is positive for past infection. Hard to know if it is recent or remote past infection. Given recent rash though, I would like to treat her with doxycycline.

## 2016-11-03 NOTE — Telephone Encounter (Signed)
Notified pt and she voices understanding. 

## 2016-11-30 DIAGNOSIS — H2513 Age-related nuclear cataract, bilateral: Secondary | ICD-10-CM | POA: Diagnosis not present

## 2016-11-30 DIAGNOSIS — H25013 Cortical age-related cataract, bilateral: Secondary | ICD-10-CM | POA: Diagnosis not present

## 2017-01-09 DIAGNOSIS — Z85820 Personal history of malignant melanoma of skin: Secondary | ICD-10-CM | POA: Diagnosis not present

## 2017-01-09 DIAGNOSIS — D229 Melanocytic nevi, unspecified: Secondary | ICD-10-CM | POA: Diagnosis not present

## 2017-01-09 DIAGNOSIS — L814 Other melanin hyperpigmentation: Secondary | ICD-10-CM | POA: Diagnosis not present

## 2017-02-08 DIAGNOSIS — M1811 Unilateral primary osteoarthritis of first carpometacarpal joint, right hand: Secondary | ICD-10-CM | POA: Diagnosis not present

## 2017-02-08 DIAGNOSIS — M1812 Unilateral primary osteoarthritis of first carpometacarpal joint, left hand: Secondary | ICD-10-CM | POA: Diagnosis not present

## 2017-02-14 DIAGNOSIS — Z23 Encounter for immunization: Secondary | ICD-10-CM | POA: Diagnosis not present

## 2017-03-22 DIAGNOSIS — M1811 Unilateral primary osteoarthritis of first carpometacarpal joint, right hand: Secondary | ICD-10-CM | POA: Diagnosis not present

## 2017-04-10 ENCOUNTER — Encounter: Payer: Self-pay | Admitting: Family

## 2017-04-10 ENCOUNTER — Encounter: Payer: Self-pay | Admitting: Gastroenterology

## 2017-04-10 DIAGNOSIS — Z1211 Encounter for screening for malignant neoplasm of colon: Secondary | ICD-10-CM

## 2017-05-09 ENCOUNTER — Other Ambulatory Visit: Payer: Self-pay | Admitting: Family

## 2017-05-09 NOTE — Telephone Encounter (Signed)
Pt last seen 10/31/16 and has wellness visit scheduled with Glenard Haring on 10/30/17. Pt has no further appts with PCP. Please advise when pt should follow up in the office?

## 2017-05-12 NOTE — Telephone Encounter (Signed)
Lets bring her in after the new year please.

## 2017-05-15 ENCOUNTER — Ambulatory Visit: Payer: Self-pay

## 2017-05-15 NOTE — Telephone Encounter (Signed)
Pt called to say she has been having intermittent episodes of chest pain since August. She stated the last episode was this past Saturday. Pt states the episodes last 1 -3 minutes and states when she is having a "bad episode" the pain starts in the center of her chest and moves up the side of her left neck.  She was not having chest pain at time of call. Pt states she has had shortness of breath "once or twice." Pt is attributing symptoms to "anxiety" or "acid reflux". She states she has been caring for her disabled husband. She states she has been on anti-anxiety med for the last 2 years. Appointment made for tomorrow 05/16/17 @ 1200 with PCP.  Pt sx did not meet or fit with chest pain protocol. Answer Assessment - Initial Assessment Questions 1. LOCATION: "Where does it hurt?"       Center of chest Not hurting at time of call 2. RADIATION: "Does the pain go anywhere else?" (e.g., into neck, jaw, arms, back)     Moves to left side of neck 3. ONSET: "When did the chest pain begin?" (Minutes, hours or days)      Since August 4. PATTERN "Does the pain come and go, or has it been constant since it started?"  "Does it get worse with exertion?"      Pain comes and goes Had not had "an episode for a month until Saturday" 5. DURATION: "How long does it last" (e.g., seconds, minutes, hours)     1 minute to 2-3 minutes 6. SEVERITY: "How bad is the pain?"  (e.g., Scale 1-10; mild, moderate, or severe)    - MILD (1-3): doesn't interfere with normal activities     - MODERATE (4-7): interferes with normal activities or awakens from sleep    - SEVERE (8-10): excruciating pain, unable to do any normal activities       3-4/10 7. CARDIAC RISK FACTORS: "Do you have any history of heart problems or risk factors for heart disease?" (e.g., prior heart attack, angina; high blood pressure, diabetes, being overweight, high cholesterol, smoking, or strong family history of heart disease)     High cholesterol only 8.  PULMONARY RISK FACTORS: "Do you have any history of lung disease?"  (e.g., blood clots in lung, asthma, emphysema, birth control pills)     no 9. CAUSE: "What do you think is causing the chest pain?"     Pt questions anxiety (is on meds for anxiety for the past 2 years), or acid reflux 10. OTHER SYMPTOMS: "Do you have any other symptoms?" (e.g., dizziness, nausea, vomiting, sweating, fever, difficulty breathing, cough)       "once or twice" pt states she was having difficulty breathing. No other symptoms 11. PREGNANCY: "Is there any chance you are pregnant?" "When was your last menstrual period?"       n/a  Protocols used: CHEST PAIN-A-AH

## 2017-05-16 ENCOUNTER — Encounter: Payer: Self-pay | Admitting: Family

## 2017-05-16 ENCOUNTER — Ambulatory Visit (INDEPENDENT_AMBULATORY_CARE_PROVIDER_SITE_OTHER): Payer: Medicare Other | Admitting: Family

## 2017-05-16 VITALS — BP 127/57 | HR 64 | Temp 97.8°F | Resp 16 | Ht 62.0 in | Wt 142.0 lb

## 2017-05-16 DIAGNOSIS — K219 Gastro-esophageal reflux disease without esophagitis: Secondary | ICD-10-CM

## 2017-05-16 DIAGNOSIS — R079 Chest pain, unspecified: Secondary | ICD-10-CM

## 2017-05-16 LAB — CBC WITH DIFFERENTIAL/PLATELET
Basophils Absolute: 0 10*3/uL (ref 0.0–0.1)
Basophils Relative: 0.4 % (ref 0.0–3.0)
Eosinophils Absolute: 0.1 10*3/uL (ref 0.0–0.7)
Eosinophils Relative: 1.1 % (ref 0.0–5.0)
HCT: 41.3 % (ref 36.0–46.0)
Hemoglobin: 13.6 g/dL (ref 12.0–15.0)
LYMPHS PCT: 32.4 % (ref 12.0–46.0)
Lymphs Abs: 2.1 10*3/uL (ref 0.7–4.0)
MCHC: 32.9 g/dL (ref 30.0–36.0)
MCV: 93.8 fl (ref 78.0–100.0)
MONOS PCT: 6.4 % (ref 3.0–12.0)
Monocytes Absolute: 0.4 10*3/uL (ref 0.1–1.0)
NEUTROS ABS: 3.9 10*3/uL (ref 1.4–7.7)
NEUTROS PCT: 59.7 % (ref 43.0–77.0)
Platelets: 238 10*3/uL (ref 150.0–400.0)
RBC: 4.4 Mil/uL (ref 3.87–5.11)
RDW: 13.4 % (ref 11.5–15.5)
WBC: 6.6 10*3/uL (ref 4.0–10.5)

## 2017-05-16 LAB — COMPREHENSIVE METABOLIC PANEL
ALK PHOS: 47 U/L (ref 39–117)
ALT: 14 U/L (ref 0–35)
AST: 20 U/L (ref 0–37)
Albumin: 4.5 g/dL (ref 3.5–5.2)
BUN: 16 mg/dL (ref 6–23)
CO2: 27 mEq/L (ref 19–32)
Calcium: 9.8 mg/dL (ref 8.4–10.5)
Chloride: 104 mEq/L (ref 96–112)
Creatinine, Ser: 0.7 mg/dL (ref 0.40–1.20)
GFR: 87.82 mL/min (ref >60.00)
GLUCOSE: 89 mg/dL (ref 70–99)
POTASSIUM: 4.6 meq/L (ref 3.5–5.1)
Sodium: 137 mEq/L (ref 135–145)
TOTAL PROTEIN: 7.4 g/dL (ref 6.0–8.3)
Total Bilirubin: 0.5 mg/dL (ref 0.2–1.2)

## 2017-05-16 MED ORDER — OMEPRAZOLE 40 MG PO CPDR: 40.0000 mg | DELAYED_RELEASE_CAPSULE | Freq: Every day | ORAL | 3 refills | Status: DC

## 2017-05-16 NOTE — Progress Notes (Addendum)
Subjective:    Patient ID: Samantha Landry, female    DOB: 06-14-1945, 71 y.o.   MRN: [id number]  HPI  Samantha Landry is a 71 yr old female who presents today with chief complaint of intermittent chest pain.  First started 5 months ago.  Initially was occurring every 2-3 weeks  Describes as a chest pressure which lasts 2-3 minutes.  Last time it occurred was on Saturday at rest. Last two times it "moved up my neck a little bit."  Felt "like I couldn't swallow."  Notes that her husband's dementia has worsened and this is causing her more stress.  Has been having worsening gerd symptoms despite use of zantac.  Denies recent long travel trips of SOB.    Review of Systems See HPI  Past Medical History:  Diagnosis Date  . Arthritis   . Diverticulosis of colon   . Heart murmur   . Melanoma in situ ([place]) 02/2007   right arm  . Pulmonary nodules 02/2007   2-42mm     Social History   Socioeconomic History  . Marital status: Married    Spouse name: Not on file  . Number of children: 2  . Years of education: Not on file  . Highest education level: Not on file  Social Needs  . Financial resource strain: Not on file  . Food insecurity - worry: Not on file  . Food insecurity - inability: Not on file  . Transportation needs - medical: Not on file  . Transportation needs - non-medical: Not on file  Occupational History  . Occupation: retired    Comment: Primary school teacher  Tobacco Use  . Smoking status: Never Smoker  . Smokeless tobacco: Never Used  Substance and Sexual Activity  . Alcohol use: Yes    Comment: 4-5 glasses per week  . Drug use: No  . Sexual activity: No  Other Topics Concern  . 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 COLONOSCOPY    . DILATION AND CURETTAGE OF UTERUS  2002   uterine  fibroids  . TONSILLECTOMY      Family History  Problem Relation Age of Onset  . Lung cancer Father   . Colon cancer Cousin        1st pat.cousin  . Rectal cancer Neg Hx   . Stomach cancer Neg Hx     Allergies  Allergen Reactions  . Codeine Nausea And Vomiting    REACTION: Jittery    Current Outpatient Medications on File Prior to Visit  Medication Sig Dispense Refill  . aspirin 81 MG tablet Take 81 mg by mouth daily.      Marland Kitchen atorvastatin (LIPITOR) 20 MG tablet TAKE 1 TABLET BY MOUTH ONCE DAILY 90 tablet 0  . Calcium-Vitamin D-Vitamin K [id number] MG-UNT-MCG TABS Take 1 tablet by mouth daily.    Marland Kitchen doxycycline (VIBRA-TABS) 100 MG tablet Take 1 tablet (100 mg total) by mouth 2 (two) times daily. 14 tablet 0  . escitalopram (LEXAPRO) 10 MG tablet Take 1 tablet (10 mg total) by mouth daily. 90 tablet 1  . fish oil-omega-3 fatty acids 1000 MG capsule Take 1 capsule by mouth daily.      . RaNITidine HCl (ZANTAC PO) Take by mouth as needed.  No current facility-administered medications on file prior to visit.     BP (!) 127/57 (BP Location: Left Arm, Patient Position: Sitting, Cuff Size: Small)   Pulse 64   Temp 97.8 F (36.6 C) (Oral)   Resp 16   Ht 5\' 2"  (1.575 m)   Wt 142 lb (64.4 kg)   SpO2 98%   BMI 25.97 kg/m       Objective:   Physical Exam  Constitutional: She is oriented to person, place, and time. She appears well-developed and well-nourished.  HENT:  Head: Normocephalic and atraumatic.  Cardiovascular: Normal rate, regular rhythm and normal heart sounds.  No murmur heard. Pulmonary/Chest: Effort normal and breath sounds normal. No respiratory distress. She has no wheezes.  Musculoskeletal: She exhibits no edema.  Neurological: She is alert and oriented to person, place, and time.  Psychiatric: She has a normal mood and affect. Her behavior is normal. Judgment and thought content normal.          Assessment & Plan:  Atypical chest pain- suspect  related to uncontrolled gerd as well as stress. Will obtain cmet, cbc.  EKG tracing is personally reviewed.  EKG notes NSR.  No acute changes.   Will d/c zantac and have pt begin omeprazole and will also send for exercise stress test.  Pt is advised as follows:   Go to the ER if you develop recurrent chest pain which does not resolve on its own in a few minutes. Pt verbalizes understanding.

## 2017-05-16 NOTE — Patient Instructions (Addendum)
You will be contacted about scheduling your stress test. Please complete lab work prior to leaving. Go to the ER if you develop recurrent chest pain which does not resolve on its own in a few minutes. Stop zantac, start omeprazole for reflux

## 2017-05-17 NOTE — Telephone Encounter (Signed)
Pt was seen in the office on 05/16/17.

## 2017-05-23 ENCOUNTER — Other Ambulatory Visit: Payer: Self-pay | Admitting: Family

## 2017-05-24 ENCOUNTER — Other Ambulatory Visit: Payer: Self-pay

## 2017-05-24 ENCOUNTER — Ambulatory Visit (AMBULATORY_SURGERY_CENTER): Payer: Self-pay

## 2017-05-24 VITALS — Ht 64.0 in | Wt 143.0 lb

## 2017-05-24 DIAGNOSIS — Z86010 Personal history of colonic polyps: Secondary | ICD-10-CM

## 2017-05-24 MED ORDER — PEG-KCL-NACL-NASULF-NA ASC-C 140 G PO SOLR: 1.0000 | Freq: Once | ORAL | 0 refills | Status: AC

## 2017-05-24 NOTE — Progress Notes (Signed)
Denies allergies to eggs or soy products. Denies complication of anesthesia or sedation. Denies use of weight loss medication. Denies use of O2.   Emmi instructions declined.  

## 2017-06-05 HISTORY — PX: EYE SURGERY: SHX253

## 2017-06-07 ENCOUNTER — Telehealth: Payer: Self-pay | Admitting: Gastroenterology

## 2017-06-07 ENCOUNTER — Encounter: Payer: Self-pay | Admitting: Gastroenterology

## 2017-06-07 NOTE — Telephone Encounter (Signed)
Returned phone call to patient. Explained that coupons are not honored with Medicare. Cost of prep for pt is $42.00. We discussed what coupons do cover as well and that cost would be greater than her charge. She understands. Pharmacy also did not have prep stocked, to be arriving today. If pharmacy does not receive prep today she will call us back to submit Miralax instructions to her My Chart and review over the phone.

## 2017-06-07 NOTE — Telephone Encounter (Signed)
Error

## 2017-06-08 ENCOUNTER — Encounter: Payer: Self-pay | Admitting: Family

## 2017-06-08 NOTE — Telephone Encounter (Signed)
Pt has been scheduled for 06/15/17 at 3pm and pt is already aware of appt.

## 2017-06-08 NOTE — Telephone Encounter (Signed)
Message routed to referral co-ordinator for referral status.

## 2017-06-11 ENCOUNTER — Encounter: Payer: Self-pay | Admitting: Gastroenterology

## 2017-06-11 ENCOUNTER — Ambulatory Visit (AMBULATORY_SURGERY_CENTER): Payer: Medicare Other | Admitting: Gastroenterology

## 2017-06-11 VITALS — BP 102/74 | HR 71 | Temp 99.1°F | Resp 16 | Ht 64.0 in | Wt 143.0 lb

## 2017-06-11 DIAGNOSIS — Z860100 Personal history of colon polyps, unspecified: Secondary | ICD-10-CM

## 2017-06-11 DIAGNOSIS — Z85038 Personal history of other malignant neoplasm of large intestine: Secondary | ICD-10-CM | POA: Diagnosis not present

## 2017-06-11 DIAGNOSIS — K219 Gastro-esophageal reflux disease without esophagitis: Secondary | ICD-10-CM | POA: Diagnosis not present

## 2017-06-11 MED ORDER — SODIUM CHLORIDE 0.9 % IV SOLN: 500.0000 mL | Freq: Once | INTRAVENOUS | Status: DC

## 2017-06-11 NOTE — Progress Notes (Signed)
Report given to PACU, vss 

## 2017-06-11 NOTE — Op Note (Signed)
[facility] Patient Name: Samantha Landry Procedure Date: 06/11/2017 8:27 AM MRN: [id number] Endoscopist: Mallie Mussel L. Loletha Carrow , MD Age: 72 Referring MD:  Date of Birth: 1945/08/10 Gender: Female Account #: [id number] Procedure:                Colonoscopy Indications:              Surveillance: Personal history of adenomatous                            polyps on last colonoscopy > 5 years ago (87mm TA                            04/2012) Medicines:                Monitored Anesthesia Care Procedure:                Pre-Anesthesia Assessment:                           - Prior to the procedure, a History and Physical                            was performed, and patient medications and                            allergies were reviewed. The patient's tolerance of                            previous anesthesia was also reviewed. The risks                            and benefits of the procedure and the sedation                            options and risks were discussed with the patient.                            All questions were answered, and informed consent                            was obtained. Anticoagulants: The patient has taken                            aspirin. It was decided not to withhold this                            medication prior to the procedure. ASA Grade                            Assessment: II - A patient with mild systemic                            disease. After reviewing the risks and benefits,  the patient was deemed in satisfactory condition to                            undergo the procedure.                           After obtaining informed consent, the colonoscope                            was passed under direct vision. Throughout the                            procedure, the patient's blood pressure, pulse, and                            oxygen saturations were monitored continuously. The   Colonoscope was introduced through the anus and                            advanced to the the cecum, identified by                            appendiceal orifice and ileocecal valve. The                            colonoscopy was performed without difficulty. The                            patient tolerated the procedure well. The quality                            of the bowel preparation was excellent. The                            ileocecal valve, appendiceal orifice, and rectum                            were photographed. The quality of the bowel                            preparation was evaluated using the BBPS [facility]                            Bowel Preparation Scale) with scores of: Right                            Colon = 3, Transverse Colon = 3 and Left Colon = 3.                            The total BBPS score equals 9. The bowel                            preparation used was SUPREP. Scope In: 8:31:36 AM Scope Out: 5:64:33 AM Scope Withdrawal Time: 0  hours 9 minutes 57 seconds  Total Procedure Duration: 0 hours 14 minutes 58 seconds  Findings:                 The perianal and digital rectal examinations were                            normal.                           Internal hemorrhoids were found. The hemorrhoids                            were Grade I (internal hemorrhoids that do not                            prolapse).                           The exam was otherwise without abnormality on                            direct and retroflexion views. Complications:            No immediate complications. Estimated Blood Loss:     Estimated blood loss: none. Impression:               - Internal hemorrhoids.                           - The examination was otherwise normal on direct                            and retroflexion views.                           - No specimens collected. Recommendation:           - Patient has a contact number available for                             emergencies. The signs and symptoms of potential                            delayed complications were discussed with the                            patient. Return to normal activities tomorrow.                            Written discharge instructions were provided to the                            patient.                           - Resume previous diet.                           -  Continue present medications.                           - No repeat screening colonoscopy due to age. Heith Haigler L. Loletha Carrow, MD 06/11/2017 8:54:28 AM This report has been signed electronically.

## 2017-06-11 NOTE — Patient Instructions (Signed)
YOU HAD AN ENDOSCOPIC PROCEDURE TODAY AT THE [facility]:   Refer to the procedure report that was given to you for any specific questions about what was found during the examination.  If the procedure report does not answer your questions, please call your gastroenterologist to clarify.  If you requested that your care partner not be given the details of your procedure findings, then the procedure report has been included in a sealed envelope for you to review at your convenience later.  YOU SHOULD EXPECT: Some feelings of bloating in the abdomen. Passage of more gas than usual.  Walking can help get rid of the air that was put into your GI tract during the procedure and reduce the bloating. If you had a lower endoscopy (such as a colonoscopy or flexible sigmoidoscopy) you may notice spotting of blood in your stool or on the toilet paper. If you underwent a bowel prep for your procedure, you may not have a normal bowel movement for a few days.  Please Note:  You might notice some irritation and congestion in your nose or some drainage.  This is from the oxygen used during your procedure.  There is no need for concern and it should clear up in a day or so.  SYMPTOMS TO REPORT IMMEDIATELY:   Following lower endoscopy (colonoscopy or flexible sigmoidoscopy):  Excessive amounts of blood in the stool  Significant tenderness or worsening of abdominal pains  Swelling of the abdomen that is new, acute  Fever of 100F or higher  For urgent or emergent issues, a gastroenterologist can be reached at any hour by calling [phone].   DIET:  We do recommend a small meal at first, but then you may proceed to your regular diet.  Drink plenty of fluids but you should avoid alcoholic beverages for 24 hours.  MEDICATIONS: Continue present medications.  Please see handouts given to you by your recovery nurse.  ACTIVITY:  You should plan to take it easy for the rest of today and you should NOT  DRIVE or use heavy machinery until tomorrow (because of the sedation medicines used during the test).    FOLLOW UP: Our staff will call the number listed on your records the next business day following your procedure to check on you and address any questions or concerns that you may have regarding the information given to you following your procedure. If we do not reach you, we will leave a message.  However, if you are feeling well and you are not experiencing any problems, there is no need to return our call.  We will assume that you have returned to your regular daily activities without incident.  If any biopsies were taken you will be contacted by phone or by letter within the next 1-3 weeks.  Please call us at [phone] if you have not heard about the biopsies in 3 weeks.   Thank you for allowing us to provide for your healthcare needs today.   SIGNATURES/CONFIDENTIALITY: You and/or your care partner have signed paperwork which will be entered into your electronic medical record.  These signatures attest to the fact that that the information above on your After Visit Summary has been reviewed and is understood.  Full responsibility of the confidentiality of this discharge information lies with you and/or your care-partner. 

## 2017-06-11 NOTE — Progress Notes (Signed)
Pt's states no medical or surgical changes since previsit or office visit. 

## 2017-06-12 ENCOUNTER — Telehealth: Payer: Self-pay | Admitting: *Deleted

## 2017-06-12 NOTE — Telephone Encounter (Signed)
  Follow up Call-  Call back number 06/11/2017  Post procedure Call Back phone  # [phone]  Permission to leave phone message Yes  Some recent data might be hidden     Patient questions:  Do you have a fever, pain , or abdominal swelling? No. Pain Score  0 *  Have you tolerated food without any problems? Yes.    Have you been able to return to your normal activities? Yes.    Do you have any questions about your discharge instructions: Diet   No. Medications  No. Follow up visit  No.  Do you have questions or concerns about your Care? No.  Actions: * If pain score is 4 or above: No action needed, pain <4.

## 2017-06-15 ENCOUNTER — Ambulatory Visit (INDEPENDENT_AMBULATORY_CARE_PROVIDER_SITE_OTHER): Payer: Medicare Other

## 2017-06-15 DIAGNOSIS — R079 Chest pain, unspecified: Secondary | ICD-10-CM

## 2017-06-15 LAB — EXERCISE TOLERANCE TEST
CHL CUP MPHR: 150 {beats}/min
CHL CUP RESTING HR STRESS: 66 {beats}/min
CSEPPHR: 137 {beats}/min
Estimated workload: 6.2 METS
Exercise duration (min): 4 min
Exercise duration (sec): 22 s
Percent HR: 91 %
RPE: 15

## 2017-06-18 ENCOUNTER — Encounter: Payer: Self-pay | Admitting: *Deleted

## 2017-06-18 ENCOUNTER — Telehealth: Payer: Self-pay | Admitting: Family

## 2017-06-18 DIAGNOSIS — R9439 Abnormal result of other cardiovascular function study: Secondary | ICD-10-CM

## 2017-06-18 NOTE — Telephone Encounter (Signed)
See order.

## 2017-06-18 NOTE — Telephone Encounter (Signed)
Scheduled pt with Dr Geraldo Pitter at [facility] (3rd fl) for tomorrow, 06/19/17 at 9:20am (arrive 15 min early).  Pt has been notified and is agreeable to proceed with appt.

## 2017-06-19 ENCOUNTER — Encounter: Payer: Self-pay | Admitting: Family

## 2017-06-19 ENCOUNTER — Other Ambulatory Visit: Payer: Self-pay

## 2017-06-19 ENCOUNTER — Ambulatory Visit (HOSPITAL_BASED_OUTPATIENT_CLINIC_OR_DEPARTMENT_OTHER)
Admission: RE | Admit: 2017-06-19 | Discharge: 2017-06-19 | Disposition: A | Discharge status: Home / Self Care | Payer: Medicare Other | Source: Ambulatory Visit | Attending: Cardiology | Admitting: Cardiology

## 2017-06-19 ENCOUNTER — Encounter: Payer: Self-pay | Admitting: Cardiology

## 2017-06-19 ENCOUNTER — Ambulatory Visit (INDEPENDENT_AMBULATORY_CARE_PROVIDER_SITE_OTHER): Payer: Medicare Other | Admitting: Cardiology

## 2017-06-19 ENCOUNTER — Ambulatory Visit: Payer: Self-pay | Admitting: Family

## 2017-06-19 VITALS — BP 122/52 | HR 66 | Ht 64.0 in | Wt 143.0 lb

## 2017-06-19 DIAGNOSIS — R079 Chest pain, unspecified: Secondary | ICD-10-CM | POA: Diagnosis not present

## 2017-06-19 DIAGNOSIS — R9439 Abnormal result of other cardiovascular function study: Secondary | ICD-10-CM | POA: Insufficient documentation

## 2017-06-19 DIAGNOSIS — I209 Angina pectoris, unspecified: Secondary | ICD-10-CM | POA: Insufficient documentation

## 2017-06-19 DIAGNOSIS — E782 Mixed hyperlipidemia: Secondary | ICD-10-CM

## 2017-06-19 MED ORDER — NITROGLYCERIN 0.4 MG SL SUBL: 0.4000 mg | SUBLINGUAL_TABLET | SUBLINGUAL | 6 refills | Status: DC | PRN

## 2017-06-19 NOTE — H&P (View-Only) (Signed)
Cardiology Office Note:    Date:  06/19/2017   ID:  Samantha Landry, DOB 1945-06-23, MRN [id number]  PCP:  Debbrah Alar, NP  Cardiologist:  Jenean Lindau, MD   Referring MD: Debbrah Alar, NP    ASSESSMENT:    1. Abnormal stress test   2. Mixed hyperlipidemia   3. Angina pectoris ([place])    PLAN:    In order of problems listed above:  1. Her symptoms are very concerning and suggestive of angina.  Her stress test is corroborative of this diagnosis.  I have discussed the following with her in view of this.I discussed coronary angiography and left heart catheterization with the patient at extensive length. Procedure, benefits and potential risks were explained. Patient had multiple questions which were answered to the patient's satisfaction. Patient agreed and consented for the procedure. Further recommendations will be made based on the findings of the coronary angiography. In the interim. The patient has any significant symptoms he knows to go to the nearest emergency room. 2. Sublingual nitroglycerin prescription was sent, its protocol and 911 protocol explained and the patient vocalized understanding questions were answered to the patient's satisfaction 3. Diet was explained to dyslipidemia and she vocalized understanding.  She will be seen in follow-up appointment after coronary angiography.   Medication Adjustments/Labs and Tests Ordered: Current medicines are reviewed at length with the patient today.  Concerns regarding medicines are outlined above.  No orders of the defined types were placed in this encounter.  No orders of the defined types were placed in this encounter.    History of Present Illness:    Samantha Landry is a 72 y.o. female who is being seen today for the evaluation of chest pain suggesting of angina and abnormal stress test at the request of Debbrah Alar, NP.  Patient is a pleasant 72 year old female.  She has past medical history of  dyslipidemia and mentions to me that over the holidays she has been experiencing substernal chest tightness radiating to the neck.  No orthopnea or PND.  This happens sometimes with exertion.  No syncope no seizures.  At the time of my evaluation, the patient is alert awake oriented and in no distress.  For this reason the patient underwent stress testing and very early in the exercise and at low levels of exercise and workload, she developed horizontal ST segment depression suggesting of ischemia for which she was referred here.  Past Medical History:  Diagnosis Date  . Allergy   . Alopecia 08/03/2011  . Anxiety   . Anxiety and depression 11/25/2015  . Arthritis   . Benign neoplasm of skin of trunk 11/14/2010  . Cancer ([place])   . Cataract   . Diverticulosis of colon   . DIVERTICULOSIS, COLON 03/26/2007   Qualifier: Diagnosis of  By: Wynona Luna   . GERD (gastroesophageal reflux disease)   . Heart murmur   . HOT FLASHES 03/25/2007   Qualifier: Diagnosis of  By: Wynona Luna   . Hyperlipidemia   . Melanoma in situ ([place]) 02/2007   right arm  . Osteopenia 08/20/2012  . PULMONARY NODULE 03/25/2007   Qualifier: Diagnosis of  By: Wynona Luna   . Pulmonary nodules 02/2007   2-24mm  . SKIN CANCER, HX OF 03/26/2007   Qualifier: Diagnosis of  By: Wynona Luna     Past Surgical History:  Procedure Laterality Date  . BUNIONECTOMY Left   . COLONOSCOPY    .  DILATION AND CURETTAGE OF UTERUS  2002   uterine fibroids  . TONSILLECTOMY      Current Medications: Current Meds  Medication Sig  . aspirin 81 MG tablet Take 81 mg by mouth daily.    Marland Kitchen atorvastatin (LIPITOR) 20 MG tablet TAKE 1 TABLET BY MOUTH ONCE DAILY  . Calcium-Vitamin D-Vitamin K 332-951-88 MG-UNT-MCG TABS Take 1 tablet by mouth daily.  . diclofenac sodium (VOLTAREN) 1 % GEL Apply topically 4 (four) times daily.  Marland Kitchen escitalopram (LEXAPRO) 10 MG tablet TAKE 1 TABLET BY MOUTH ONCE DAILY  . fish oil-omega-3 fatty  acids 1000 MG capsule Take 1 capsule by mouth daily.    . Melatonin 1 MG/4ML LIQD Take 1 mg by mouth as needed.  Marland Kitchen omeprazole (PRILOSEC) 40 MG capsule Take 1 capsule (40 mg total) by mouth daily.   Current Facility-Administered Medications for the 06/19/17 encounter (Office Visit) with , Reita Cliche, MD  Medication  . 0.9 %  sodium chloride infusion     Allergies:   Codeine   Social History   Socioeconomic History  . Marital status: Married    Spouse name: None  . Number of children: 2  . Years of education: None  . Highest education level: None  Social Needs  . Financial resource strain: None  . Food insecurity - worry: None  . Food insecurity - inability: None  . Transportation needs - medical: None  . Transportation needs - non-medical: None  Occupational History  . Occupation: retired    Comment: Primary school teacher  Tobacco Use  . Smoking status: Never Smoker  . Smokeless tobacco: Never Used  Substance and Sexual Activity  . Alcohol use: Yes    Comment: 4-5 glasses of wine per week  . Drug use: No  . Sexual activity: No  Other Topics Concern  . 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 There is no history of Rectal cancer, Stomach cancer, Esophageal cancer, or Pancreatic cancer.  ROS:   Please see the history of present illness.    All other systems reviewed and are negative.  EKGs/Labs/Other Studies Reviewed:    The following studies were reviewed today: I discussed findings with the patient at extensive length.  EKG done at the doctor's office was unremarkable.  Stress test report was discussed with her at length.   Recent Labs: 10/31/2016: TSH 0.67 05/16/2017: ALT 14; BUN 16; Creatinine, Ser 0.70; Hemoglobin 13.6;  Platelets 238.0; Potassium 4.6; Sodium 137  Recent Lipid Panel    Component Value Date/Time   CHOL 138 10/31/2016 1156   TRIG 169.0 (H) 10/31/2016 1156   HDL 47.40 10/31/2016 1156   CHOLHDL 3 10/31/2016 1156   VLDL 33.8 10/31/2016 1156   LDLCALC 57 10/31/2016 1156   LDLDIRECT 142.9 04/04/2007 1144    Physical Exam:    VS:  BP (!) 122/52 (BP Location: Right Arm, Patient Position: Sitting, Cuff Size: Large)   Pulse 66   Ht 5\' 4"  (1.626 m)   Wt 143 lb (64.9 kg)   SpO2 98%   BMI 24.55 kg/m     Wt Readings from Last 3 Encounters:  06/19/17 143 lb (64.9 kg)  06/11/17 143 lb (64.9 kg)  05/24/17 143 lb (  64.9 kg)     GEN: Patient is in no acute distress HEENT: Normal NECK: No JVD; No carotid bruits LYMPHATICS: No lymphadenopathy CARDIAC: S1 S2 regular, 2/6 systolic murmur at the apex. RESPIRATORY:  Clear to auscultation without rales, wheezing or rhonchi  ABDOMEN: Soft, non-tender, non-distended MUSCULOSKELETAL:  No edema; No deformity  SKIN: Warm and dry NEUROLOGIC:  Alert and oriented x 3 PSYCHIATRIC:  Normal affect    Signed, Jenean Lindau, MD  06/19/2017 9:10 AM    [place]

## 2017-06-19 NOTE — Addendum Note (Signed)
Addended by: Mattie Marlin on: 06/19/2017 09:22 AM   Modules accepted: Orders

## 2017-06-19 NOTE — Progress Notes (Signed)
Cardiology Office Note:    Date:  06/19/2017   ID:  Samantha Landry, DOB 03-28-1946, MRN [id number]  PCP:  Debbrah Alar, NP  Cardiologist:  Jenean Lindau, MD   Referring MD: Debbrah Alar, NP    ASSESSMENT:    1. Abnormal stress test   2. Mixed hyperlipidemia   3. Angina pectoris ([place])    PLAN:    In order of problems listed above:  1. Her symptoms are very concerning and suggestive of angina.  Her stress test is corroborative of this diagnosis.  I have discussed the following with her in view of this.I discussed coronary angiography and left heart catheterization with the patient at extensive length. Procedure, benefits and potential risks were explained. Patient had multiple questions which were answered to the patient's satisfaction. Patient agreed and consented for the procedure. Further recommendations will be made based on the findings of the coronary angiography. In the interim. The patient has any significant symptoms he knows to go to the nearest emergency room. 2. Sublingual nitroglycerin prescription was sent, its protocol and 911 protocol explained and the patient vocalized understanding questions were answered to the patient's satisfaction 3. Diet was explained to dyslipidemia and she vocalized understanding.  She will be seen in follow-up appointment after coronary angiography.   Medication Adjustments/Labs and Tests Ordered: Current medicines are reviewed at length with the patient today.  Concerns regarding medicines are outlined above.  No orders of the defined types were placed in this encounter.  No orders of the defined types were placed in this encounter.    History of Present Illness:    Samantha Landry is a 72 y.o. female who is being seen today for the evaluation of chest pain suggesting of angina and abnormal stress test at the request of Debbrah Alar, NP.  Patient is a pleasant 72 year old female.  She has past medical history of  dyslipidemia and mentions to me that over the holidays she has been experiencing substernal chest tightness radiating to the neck.  No orthopnea or PND.  This happens sometimes with exertion.  No syncope no seizures.  At the time of my evaluation, the patient is alert awake oriented and in no distress.  For this reason the patient underwent stress testing and very early in the exercise and at low levels of exercise and workload, she developed horizontal ST segment depression suggesting of ischemia for which she was referred here.  Past Medical History:  Diagnosis Date  . Allergy   . Alopecia 08/03/2011  . Anxiety   . Anxiety and depression 11/25/2015  . Arthritis   . Benign neoplasm of skin of trunk 11/14/2010  . Cancer ([place])   . Cataract   . Diverticulosis of colon   . DIVERTICULOSIS, COLON 03/26/2007   Qualifier: Diagnosis of  By: Wynona Luna   . GERD (gastroesophageal reflux disease)   . Heart murmur   . HOT FLASHES 03/25/2007   Qualifier: Diagnosis of  By: Wynona Luna   . Hyperlipidemia   . Melanoma in situ ([place]) 02/2007   right arm  . Osteopenia 08/20/2012  . PULMONARY NODULE 03/25/2007   Qualifier: Diagnosis of  By: Wynona Luna   . Pulmonary nodules 02/2007   2-17mm  . SKIN CANCER, HX OF 03/26/2007   Qualifier: Diagnosis of  By: Wynona Luna     Past Surgical History:  Procedure Laterality Date  . BUNIONECTOMY Left   . COLONOSCOPY    .  DILATION AND CURETTAGE OF UTERUS  2002   uterine fibroids  . TONSILLECTOMY      Current Medications: Current Meds  Medication Sig  . aspirin 81 MG tablet Take 81 mg by mouth daily.    Marland Kitchen atorvastatin (LIPITOR) 20 MG tablet TAKE 1 TABLET BY MOUTH ONCE DAILY  . Calcium-Vitamin D-Vitamin K [id number] MG-UNT-MCG TABS Take 1 tablet by mouth daily.  . diclofenac sodium (VOLTAREN) 1 % GEL Apply topically 4 (four) times daily.  Marland Kitchen escitalopram (LEXAPRO) 10 MG tablet TAKE 1 TABLET BY MOUTH ONCE DAILY  . fish oil-omega-3 fatty  acids 1000 MG capsule Take 1 capsule by mouth daily.    . Melatonin 1 MG/4ML LIQD Take 1 mg by mouth as needed.  Marland Kitchen omeprazole (PRILOSEC) 40 MG capsule Take 1 capsule (40 mg total) by mouth daily.   Current Facility-Administered Medications for the 06/19/17 encounter (Office Visit) with Shiquan Mathieu, Reita Cliche, MD  Medication  . 0.9 %  sodium chloride infusion     Allergies:   Codeine   Social History   Socioeconomic History  . Marital status: Married    Spouse name: None  . Number of children: 2  . Years of education: None  . Highest education level: None  Social Needs  . Financial resource strain: None  . Food insecurity - worry: None  . Food insecurity - inability: None  . Transportation needs - medical: None  . Transportation needs - non-medical: None  Occupational History  . Occupation: retired    Comment: Primary school teacher  Tobacco Use  . Smoking status: Never Smoker  . Smokeless tobacco: Never Used  Substance and Sexual Activity  . Alcohol use: Yes    Comment: 4-5 glasses of wine per week  . Drug use: No  . Sexual activity: No  Other Topics Concern  . 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 There is no history of Rectal cancer, Stomach cancer, Esophageal cancer, or Pancreatic cancer.  ROS:   Please see the history of present illness.    All other systems reviewed and are negative.  EKGs/Labs/Other Studies Reviewed:    The following studies were reviewed today: I discussed findings with the patient at extensive length.  EKG done at the doctor's office was unremarkable.  Stress test report was discussed with her at length.   Recent Labs: 10/31/2016: TSH 0.67 05/16/2017: ALT 14; BUN 16; Creatinine, Ser 0.70; Hemoglobin 13.6;  Platelets 238.0; Potassium 4.6; Sodium 137  Recent Lipid Panel    Component Value Date/Time   CHOL 138 10/31/2016 1156   TRIG 169.0 (H) 10/31/2016 1156   HDL 47.40 10/31/2016 1156   CHOLHDL 3 10/31/2016 1156   VLDL 33.8 10/31/2016 1156   LDLCALC 57 10/31/2016 1156   LDLDIRECT 142.9 04/04/2007 1144    Physical Exam:    VS:  BP (!) 122/52 (BP Location: Right Arm, Patient Position: Sitting, Cuff Size: Large)   Pulse 66   Ht 5\' 4"  (1.626 m)   Wt 143 lb (64.9 kg)   SpO2 98%   BMI 24.55 kg/m     Wt Readings from Last 3 Encounters:  06/19/17 143 lb (64.9 kg)  06/11/17 143 lb (64.9 kg)  05/24/17 143 lb (  64.9 kg)     GEN: Patient is in no acute distress HEENT: Normal NECK: No JVD; No carotid bruits LYMPHATICS: No lymphadenopathy CARDIAC: S1 S2 regular, 2/6 systolic murmur at the apex. RESPIRATORY:  Clear to auscultation without rales, wheezing or rhonchi  ABDOMEN: Soft, non-tender, non-distended MUSCULOSKELETAL:  No edema; No deformity  SKIN: Warm and dry NEUROLOGIC:  Alert and oriented x 3 PSYCHIATRIC:  Normal affect    Signed, Jenean Lindau, MD  06/19/2017 9:10 AM    [place]

## 2017-06-19 NOTE — Patient Instructions (Addendum)
Medication Instructions:  Your physician has recommended you make the following change in your medication:  START Nitroglycerin 0.4 mg sublingual (under your tongue) as needed for chest pain. If experiencing chest pain, stop what you are doing and sit down. Take 1 nitroglycerin and wait 5 minutes. If chest pain continues, take another nitroglycerin and wait 5 minutes. If chest pain does not subside, take 1 more nitroglycerin and dial 911. You make take a total of 3 nitroglycerin in a 15 minute time frame.  Labwork: Your physician recommends that you have the following labs drawn: INR  Testing/Procedures: A chest x-ray takes a picture of the organs and structures inside the chest, including the heart, lungs, and blood vessels. This test can show several things, including, whether the heart is enlarges; whether fluid is building up in the lungs; and whether pacemaker / defibrillator leads are still in place.    [facility] [address], [place] [address] Dept: [phone] Loc: [phone]  Samantha Landry  06/19/2017  You are scheduled for a Cardiac Catheterization on Monday, January 21 with Dr. Glenetta Hew.  1. Please arrive at the [facility] (Main Entrance A) at [facility]: [address] [place], [place] [address] at 8:00 AM (two hours before your procedure to ensure your preparation). Free valet parking service is available.   Special note: Every effort is made to have your procedure done on time. Please understand that emergencies sometimes delay scheduled procedures.  2. Diet: Do not eat or drink anything after midnight prior to your procedure except sips of water to take medications.  3. Labs: None needed.  4. Medication instructions in preparation for your procedure:    On the morning of your procedure, take your Aspirin and any morning medicines NOT listed above.  You  may use sips of water.  5. Plan for one night stay--bring personal belongings. 6. Bring a current list of your medications and current insurance cards. 7. You MUST have a responsible person to drive you home. 8. Someone MUST be with you the first 24 hours after you arrive home or your discharge will be delayed. 9. Please wear clothes that are easy to get on and off and wear slip-on shoes.  Thank you for allowing [place] to care for you!   -- [facility]   Follow-Up: Your physician recommends that you schedule a follow-up appointment in: 1 month  Any Other Special Instructions Will Be Listed Below (If Applicable).     If you need a refill on your cardiac medications before your next appointment, please call your pharmacy.   [name], RN, BSN   Coronary Angiogram With Stent Coronary angiogram with stent placement is a procedure to widen or open a narrow blood vessel of the heart (coronary artery). Arteries may become blocked by cholesterol buildup (plaques) in the lining of the wall. When a coronary artery becomes partially blocked, blood flow to that area decreases. This may lead to chest pain or a heart attack (myocardial infarction). A stent is a small piece of metal that looks like mesh or a spring. Stent placement may be done as treatment for a heart attack or right after a coronary angiogram in which a blocked artery is found. Let your health care provider know about:  Any allergies you have.  All medicines you are taking, including vitamins, herbs, eye drops, creams,  and over-the-counter medicines.  Any problems you or family members have had with anesthetic medicines.  Any blood disorders you have.  Any surgeries you have had.  Any medical conditions you have.  Whether you are pregnant or may be pregnant. What are the risks? Generally, this is a safe procedure. However, problems may occur, including:  Damage to the  heart or its blood vessels.  A return of blockage.  Bleeding, infection, or bruising at the insertion site.  A collection of blood under the skin (hematoma) at the insertion site.  A blood clot in another part of the body.  Kidney injury.  Allergic reaction to the dye or contrast that is used.  Bleeding into the abdomen (retroperitoneal bleeding).  What happens before the procedure? Staying hydrated Follow instructions from your health care provider about hydration, which may include:  Up to 2 hours before the procedure - you may continue to drink clear liquids, such as water, clear fruit juice, black coffee, and plain tea.  Eating and drinking restrictions Follow instructions from your health care provider about eating and drinking, which may include:  8 hours before the procedure - stop eating heavy meals or foods such as meat, fried foods, or fatty foods.  6 hours before the procedure - stop eating light meals or foods, such as toast or cereal.  2 hours before the procedure - stop drinking clear liquids.  Ask your health care provider about:  Changing or stopping your regular medicines. This is especially important if you are taking diabetes medicines or blood thinners.  Taking medicines such as ibuprofen. These medicines can thin your blood. Do not take these medicines before your procedure if your health care provider instructs you not to. Generally, aspirin is recommended before a procedure of passing a small, thin tube (catheter) through a blood vessel and into the heart (cardiac catheterization).  What happens during the procedure?  An IV tube will be inserted into one of your veins.  You will be given one or more of the following: ? A medicine to help you relax (sedative). ? A medicine to numb the area where the catheter will be inserted into an artery (local anesthetic).  To reduce your risk of infection: ? Your health care team will wash or sanitize their  hands. ? Your skin will be washed with soap. ? Hair may be removed from the area where the catheter will be inserted.  Using a guide wire, the catheter will be inserted into an artery. The location may be in your groin, in your wrist, or in the fold of your arm (near your elbow).  A type of X-ray (fluoroscopy) will be used to help guide the catheter to the opening of the arteries in the heart.  A dye will be injected into the catheter, and X-rays will be taken. The dye will help to show where any narrowing or blockages are located in the arteries.  A tiny wire will be guided to the blocked spot, and a balloon will be inflated to make the artery wider.  The stent will be expanded and will crush the plaques into the wall of the vessel. The stent will hold the area open and improve the blood flow. Most stents have a drug coating to reduce the risk of the stent narrowing over time.  The artery may be made wider using a drill, laser, or other tools to remove plaques.  When the blood flow is better, the catheter will be removed. The  lining of the artery will grow over the stent, which stays where it was placed. This procedure may vary among health care providers and hospitals. What happens after the procedure?  If the procedure is done through the leg, you will be kept in bed lying flat for about 6 hours. You will be instructed to not bend and not cross your legs.  The insertion site will be checked frequently.  The pulse in your foot or wrist will be checked frequently.  You may have additional blood tests, X-rays, and a test that records the electrical activity of your heart (electrocardiogram, or ECG). This information is not intended to replace advice given to you by your health care provider. Make sure you discuss any questions you have with your health care provider. Document Released: 11/26/2002 Document Revised: 01/20/2016 Document Reviewed: 12/26/2015 Elsevier Interactive Patient  Education  2018 [employer].  Chest X-Ray A chest X-ray is a painless test that uses radiation to create images of the structures inside of your chest. Chest X-rays are used to look for many health conditions, including heart failure, pneumonia, tuberculosis, rib fractures, breathing disorders, and cancer. They may be used to diagnose chest pain, constant coughing, or trouble breathing. Tell a health care provider about:  Any allergies you have.  All medicines you are taking, including vitamins, herbs, eye drops, creams, and over-the-counter medicines.  Any surgeries you have had.  Any medical conditions you have.  Whether you are pregnant or may be pregnant. What are the risks? Getting a chest X-ray is a safe procedure. However, you will be exposed to a small amount of radiation. Being exposed to too much radiation over a lifetime can increase the risk of cancer. This risk is small, but it may occur if you have many X-rays throughout your life. What happens before the procedure?  You may be asked to remove glasses, jewelry, and any other metal objects.  You will be asked to undress from the waist up. You may be given a hospital gown to wear.  You may be asked to wear a protective lead apron to protect parts of your body from radiation. What happens during the procedure?  You will be asked to stand still as each picture is taken to get the best possible images.  You will be asked to take a deep breath and hold your breath for a few seconds.  The X-ray machine will create a picture of your chest using a tiny burst of radiation. This is painless.  More pictures may be taken from other angles. Typically, one picture will be taken while you face the X-ray camera, and another picture will be taken from the side while you stand. If you cannot stand, you may be asked to lie down. The procedure may vary among health care providers and hospitals. What happens after the procedure?  The  X-ray(s) will be reviewed by your health care provider or an X-ray (radiology) specialist.  It is up to you to get your test results. Ask your health care provider, or the department that is doing the test, when your results will be ready.  Your health care provider will tell you if you need more tests or a follow-up exam. Keep all follow-up visits as told by your health care provider. This is important. Summary  A chest X-ray is a safe, painless test that is used to examine the inside of the chest, heart, and lungs.  You will need to undress from the waist up  and remove jewelry and metal objects before the procedure.  You will be exposed to a small amount of radiation during the procedure.  The X-ray machine will take one or more pictures of your chest while you remain as still as possible.  Later, a health care provider or specialist will review the test results with you. This information is not intended to replace advice given to you by your health care provider. Make sure you discuss any questions you have with your health care provider. Document Released: 07/18/2016 Document Revised: 07/18/2016 Document Reviewed: 07/18/2016 Elsevier Interactive Patient Education  2018 [employer]. Nitroglycerin sublingual tablets What is this medicine? NITROGLYCERIN (nye troe GLI ser in) is a type of vasodilator. It relaxes blood vessels, increasing the blood and oxygen supply to your heart. This medicine is used to relieve chest pain caused by angina. It is also used to prevent chest pain before activities like climbing stairs, going outdoors in cold weather, or sexual activity. This medicine may be used for other purposes; ask your health care provider or pharmacist if you have questions. COMMON BRAND NAME(S): Nitroquick, Nitrostat, Nitrotab What should I tell my health care provider before I take this medicine? They need to know if you have any of these conditions: -anemia -head injury, recent  stroke, or bleeding in the brain -liver disease -previous heart attack -an unusual or allergic reaction to nitroglycerin, other medicines, foods, dyes, or preservatives -pregnant or trying to get pregnant -breast-feeding How should I use this medicine? Take this medicine by mouth as needed. At the first sign of an angina attack (chest pain or tightness) place one tablet under your tongue. You can also take this medicine 5 to 10 minutes before an event likely to produce chest pain. Follow the directions on the prescription label. Let the tablet dissolve under the tongue. Do not swallow whole. Replace the dose if you accidentally swallow it. It will help if your mouth is not dry. Saliva around the tablet will help it to dissolve more quickly. Do not eat or drink, smoke or chew tobacco while a tablet is dissolving. If you are not better within 5 minutes after taking ONE dose of nitroglycerin, call 9-1-1 immediately to seek emergency medical care. Do not take more than 3 nitroglycerin tablets over 15 minutes. If you take this medicine often to relieve symptoms of angina, your doctor or health care professional may provide you with different instructions to manage your symptoms. If symptoms do not go away after following these instructions, it is important to call 9-1-1 immediately. Do not take more than 3 nitroglycerin tablets over 15 minutes. Talk to your pediatrician regarding the use of this medicine in children. Special care may be needed. Overdosage: If you think you have taken too much of this medicine contact a poison control center or emergency room at once. NOTE: This medicine is only for you. Do not share this medicine with others. What if I miss a dose? This does not apply. This medicine is only used as needed. What may interact with this medicine? Do not take this medicine with any of the following medications: -certain migraine medicines like ergotamine and dihydroergotamine  (DHE) -medicines used to treat erectile dysfunction like sildenafil, tadalafil, and vardenafil -riociguat This medicine may also interact with the following medications: -alteplase -aspirin -heparin -medicines for high blood pressure -medicines for mental depression -other medicines used to treat angina -phenothiazines like chlorpromazine, mesoridazine, prochlorperazine, thioridazine This list may not describe all possible interactions. Give your  health care provider a list of all the medicines, herbs, non-prescription drugs, or dietary supplements you use. Also tell them if you smoke, drink alcohol, or use illegal drugs. Some items may interact with your medicine. What should I watch for while using this medicine? Tell your doctor or health care professional if you feel your medicine is no longer working. Keep this medicine with you at all times. Sit or lie down when you take your medicine to prevent falling if you feel dizzy or faint after using it. Try to remain calm. This will help you to feel better faster. If you feel dizzy, take several deep breaths and lie down with your feet propped up, or bend forward with your head resting between your knees. You may get drowsy or dizzy. Do not drive, use machinery, or do anything that needs mental alertness until you know how this drug affects you. Do not stand or sit up quickly, especially if you are an older patient. This reduces the risk of dizzy or fainting spells. Alcohol can make you more drowsy and dizzy. Avoid alcoholic drinks. Do not treat yourself for coughs, colds, or pain while you are taking this medicine without asking your doctor or health care professional for advice. Some ingredients may increase your blood pressure. What side effects may I notice from receiving this medicine? Side effects that you should report to your doctor or health care professional as soon as possible: -blurred vision -dry mouth -skin rash -sweating -the  feeling of extreme pressure in the head -unusually weak or tired Side effects that usually do not require medical attention (report to your doctor or health care professional if they continue or are bothersome): -flushing of the face or neck -headache -irregular heartbeat, palpitations -nausea, vomiting This list may not describe all possible side effects. Call your doctor for medical advice about side effects. You may report side effects to FDA at 1-800-FDA-1088. Where should I keep my medicine? Keep out of the reach of children. Store at room temperature between 20 and 25 degrees C (68 and 77 degrees F). Store in [employer]. Protect from light and moisture. Keep tightly closed. Throw away any unused medicine after the expiration date. NOTE: This sheet is a summary. It may not cover all possible information. If you have questions about this medicine, talk to your doctor, pharmacist, or health care provider.  2018 Elsevier/Gold Standard (2013-03-20 17:57:36)

## 2017-06-20 ENCOUNTER — Ambulatory Visit: Payer: Self-pay | Admitting: Family

## 2017-06-20 LAB — PROTIME-INR
INR: 1 (ref 0.8–1.2)
Prothrombin Time: 10.5 s (ref 9.1–12.0)

## 2017-06-21 ENCOUNTER — Telehealth: Payer: Self-pay

## 2017-06-21 NOTE — Telephone Encounter (Signed)
Patient contacted pre-catheterization at [facility] scheduled for:  06/25/2017 @ 1030 Verified arrival time and place:  NT@ 0800 Confirmed AM meds to be taken pre-cath with sip of water: Take ASA Confirmed patient has responsible person to drive home post procedure and observe patient for 24 hours:  yes Addl concerns:  none

## 2017-06-22 ENCOUNTER — Ambulatory Visit: Payer: Medicare Other | Admitting: Family

## 2017-06-25 ENCOUNTER — Encounter (HOSPITAL_COMMUNITY): Payer: Self-pay | Admitting: *Deleted

## 2017-06-25 ENCOUNTER — Ambulatory Visit (HOSPITAL_COMMUNITY)
Admission: RE | Admit: 2017-06-25 | Discharge: 2017-06-25 | Disposition: A | Discharge status: Home / Self Care | Payer: Medicare Other | Source: Ambulatory Visit | Attending: Cardiology | Admitting: Cardiology

## 2017-06-25 ENCOUNTER — Encounter (HOSPITAL_COMMUNITY)
Admission: RE | Disposition: A | Discharge status: Home / Self Care | Payer: Self-pay | Source: Ambulatory Visit | Attending: Cardiology

## 2017-06-25 DIAGNOSIS — R9439 Abnormal result of other cardiovascular function study: Secondary | ICD-10-CM | POA: Diagnosis present

## 2017-06-25 DIAGNOSIS — E785 Hyperlipidemia, unspecified: Secondary | ICD-10-CM | POA: Diagnosis not present

## 2017-06-25 DIAGNOSIS — M8580 Other specified disorders of bone density and structure, unspecified site: Secondary | ICD-10-CM | POA: Insufficient documentation

## 2017-06-25 DIAGNOSIS — I209 Angina pectoris, unspecified: Secondary | ICD-10-CM | POA: Insufficient documentation

## 2017-06-25 DIAGNOSIS — Z7982 Long term (current) use of aspirin: Secondary | ICD-10-CM | POA: Insufficient documentation

## 2017-06-25 DIAGNOSIS — E782 Mixed hyperlipidemia: Secondary | ICD-10-CM | POA: Insufficient documentation

## 2017-06-25 DIAGNOSIS — R0789 Other chest pain: Secondary | ICD-10-CM | POA: Diagnosis not present

## 2017-06-25 DIAGNOSIS — Z79899 Other long term (current) drug therapy: Secondary | ICD-10-CM | POA: Insufficient documentation

## 2017-06-25 DIAGNOSIS — Z85828 Personal history of other malignant neoplasm of skin: Secondary | ICD-10-CM | POA: Diagnosis not present

## 2017-06-25 HISTORY — PX: LEFT HEART CATH AND CORONARY ANGIOGRAPHY: CATH118249

## 2017-06-25 LAB — BASIC METABOLIC PANEL
Anion gap: 10 (ref 5–15)
BUN: 13 mg/dL (ref 6–20)
CHLORIDE: 107 mmol/L (ref 101–111)
CO2: 24 mmol/L (ref 22–32)
CREATININE: 0.68 mg/dL (ref 0.44–1.00)
Calcium: 9.3 mg/dL (ref 8.9–10.3)
GFR calc Af Amer: >60 mL/min (ref >60)
Glucose, Bld: 93 mg/dL (ref 65–99)
POTASSIUM: 4.1 mmol/L (ref 3.5–5.1)
SODIUM: 141 mmol/L (ref 135–145)

## 2017-06-25 LAB — CBC
HCT: 40.9 % (ref 36.0–46.0)
HEMOGLOBIN: 13.3 g/dL (ref 12.0–15.0)
MCH: 30.5 pg (ref 26.0–34.0)
MCHC: 32.5 g/dL (ref 30.0–36.0)
MCV: 93.8 fL (ref 78.0–100.0)
PLATELETS: 229 10*3/uL (ref 150–400)
RBC: 4.36 MIL/uL (ref 3.87–5.11)
RDW: 12.9 % (ref 11.5–15.5)
WBC: 5.7 10*3/uL (ref 4.0–10.5)

## 2017-06-25 SURGERY — LEFT HEART CATH AND CORONARY ANGIOGRAPHY
Anesthesia: LOCAL

## 2017-06-25 MED ORDER — SODIUM CHLORIDE 0.9% FLUSH: 3.0000 mL | INTRAVENOUS | Status: DC | PRN

## 2017-06-25 MED ORDER — FENTANYL CITRATE (PF) 100 MCG/2ML IJ SOLN
INTRAMUSCULAR | Status: AC
  Filled 2017-06-25: qty 2

## 2017-06-25 MED ORDER — ACETAMINOPHEN 325 MG PO TABS: 650.0000 mg | ORAL_TABLET | ORAL | Status: DC | PRN

## 2017-06-25 MED ORDER — MIDAZOLAM HCL 2 MG/2ML IJ SOLN
INTRAMUSCULAR | Status: AC
  Filled 2017-06-25: qty 2

## 2017-06-25 MED ORDER — LIDOCAINE HCL (PF) 1 % IJ SOLN
INTRAMUSCULAR | Status: DC | PRN
  Administered 2017-06-25: 2 mL

## 2017-06-25 MED ORDER — SODIUM CHLORIDE 0.9% FLUSH: 3.0000 mL | Freq: Two times a day (BID) | INTRAVENOUS | Status: DC

## 2017-06-25 MED ORDER — VERAPAMIL HCL 2.5 MG/ML IV SOLN
INTRAVENOUS | Status: DC | PRN
  Administered 2017-06-25: 10 mL via INTRA_ARTERIAL

## 2017-06-25 MED ORDER — FENTANYL CITRATE (PF) 100 MCG/2ML IJ SOLN
INTRAMUSCULAR | Status: DC | PRN
  Administered 2017-06-25: 25 ug via INTRAVENOUS

## 2017-06-25 MED ORDER — MIDAZOLAM HCL 2 MG/2ML IJ SOLN
INTRAMUSCULAR | Status: DC | PRN
  Administered 2017-06-25: 1 mg via INTRAVENOUS

## 2017-06-25 MED ORDER — IOPAMIDOL (ISOVUE-370) INJECTION 76%
INTRAVENOUS | Status: DC | PRN
  Administered 2017-06-25: 80 mL via INTRA_ARTERIAL

## 2017-06-25 MED ORDER — HEPARIN SODIUM (PORCINE) 1000 UNIT/ML IJ SOLN
INTRAMUSCULAR | Status: DC | PRN
  Administered 2017-06-25: 4000 [IU] via INTRAVENOUS

## 2017-06-25 MED ORDER — HEPARIN (PORCINE) IN NACL 2-0.9 UNIT/ML-% IJ SOLN
INTRAMUSCULAR | Status: AC | PRN
  Administered 2017-06-25: 1000 mL

## 2017-06-25 MED ORDER — SODIUM CHLORIDE 0.9 % WEIGHT BASED INFUSION: 1.0000 mL/kg/h | INTRAVENOUS | Status: DC

## 2017-06-25 MED ORDER — SODIUM CHLORIDE 0.9 % IV SOLN: INTRAVENOUS | Status: DC

## 2017-06-25 MED ORDER — HEPARIN (PORCINE) IN NACL 2-0.9 UNIT/ML-% IJ SOLN
INTRAMUSCULAR | Status: AC
  Filled 2017-06-25: qty 1000

## 2017-06-25 MED ORDER — LIDOCAINE HCL (PF) 1 % IJ SOLN
INTRAMUSCULAR | Status: AC
  Filled 2017-06-25: qty 30

## 2017-06-25 MED ORDER — VERAPAMIL HCL 2.5 MG/ML IV SOLN
INTRAVENOUS | Status: AC
  Filled 2017-06-25: qty 2

## 2017-06-25 MED ORDER — ASPIRIN 81 MG PO CHEW: 81.0000 mg | CHEWABLE_TABLET | ORAL | Status: DC

## 2017-06-25 MED ORDER — SODIUM CHLORIDE 0.9 % IV SOLN: 250.0000 mL | INTRAVENOUS | Status: DC | PRN

## 2017-06-25 MED ORDER — IOPAMIDOL (ISOVUE-370) INJECTION 76%
INTRAVENOUS | Status: AC
  Filled 2017-06-25: qty 125

## 2017-06-25 MED ORDER — HEPARIN SODIUM (PORCINE) 1000 UNIT/ML IJ SOLN
INTRAMUSCULAR | Status: AC
  Filled 2017-06-25: qty 1

## 2017-06-25 MED ORDER — SODIUM CHLORIDE 0.9 % WEIGHT BASED INFUSION
3.0000 mL/kg/h | INTRAVENOUS | Status: AC
  Administered 2017-06-25: 3 mL/kg/h via INTRAVENOUS

## 2017-06-25 MED ORDER — SODIUM CHLORIDE 0.9 % IV SOLN
250.0000 mL | INTRAVENOUS | Status: DC | PRN
Start: 2017-06-25 — End: 2017-06-25

## 2017-06-25 SURGICAL SUPPLY — 13 items
CATH INFINITI 5FR ANG PIGTAIL (CATHETERS) ×2 IMPLANT
CATH OPTITORQUE TIG 4.0 5F (CATHETERS) ×2 IMPLANT
COVER PRB 48X5XTLSCP FOLD TPE (BAG) ×1 IMPLANT
COVER PROBE 5X48 (BAG) ×1
DEVICE RAD COMP TR BAND LRG (VASCULAR PRODUCTS) ×2 IMPLANT
GLIDESHEATH SLEND SS 6F .021 (SHEATH) ×2 IMPLANT
GUIDEWIRE INQWIRE 1.5J.035X260 (WIRE) ×1 IMPLANT
INQWIRE 1.5J .035X260CM (WIRE) ×2
KIT HEART LEFT (KITS) ×2 IMPLANT
PACK CARDIAC CATHETERIZATION (CUSTOM PROCEDURE TRAY) ×2 IMPLANT
SYR MEDRAD MARK V 150ML (SYRINGE) ×2 IMPLANT
TRANSDUCER W/STOPCOCK (MISCELLANEOUS) ×2 IMPLANT
TUBING CIL FLEX 10 FLL-RA (TUBING) ×2 IMPLANT

## 2017-06-25 NOTE — Interval H&P Note (Signed)
History and Physical Interval Note:  06/25/2017 11:50 AM  Samantha Landry  has presented today for surgery, with the diagnosis of angina with Abnormal GXT.   The various methods of treatment have been discussed with the patient and family. After consideration of risks, benefits and other options for treatment, the patient has consented to  Procedure(s): LEFT HEART CATH AND CORONARY ANGIOGRAPHY (N/A) with possible PERCUTANEOUS CORONARY INTERVENTION as a surgical intervention .  The patient's history has been reviewed, patient examined, no change in status, stable for surgery.  I have reviewed the patient's chart and labs.  Questions were answered to the patient's satisfaction.    Cath Lab Visit (complete for each Cath Lab visit)  Clinical Evaluation Leading to the Procedure:   ACS: No.  Non-ACS:    Anginal Classification: CCS III  Anti-ischemic medical therapy: Minimal Therapy (1 class of medications)  Non-Invasive Test Results: Intermediate-risk stress test findings: cardiac mortality 1-3%/year; ABNORMAL GXT  Prior CABG: No previous CABG   Glenetta Hew

## 2017-06-25 NOTE — Research (Signed)
OPTIMIZE Informed Consent   Subject Name: Samantha Landry  Subject met inclusion and exclusion criteria.  The informed consent form, study requirements and expectations were reviewed with the subject and questions and concerns were addressed prior to the signing of the consent form.  The subject verbalized understanding of the trail requirements.  The subject agreed to participate in the OPTIMIZE trial and signed the informed consent.  The informed consent was obtained prior to performance of any protocol-specific procedures for the subject.  A copy of the signed informed consent was given to the subject and a copy was placed in the subject's medical record. Applicable if randomized in the OPTIMIZE study.  Hedrick,Tammy W 06/25/2017, 10:10 AM  

## 2017-06-25 NOTE — Research (Signed)
CAD FEM Informed Consent   Subject Name: Samantha Landry  Subject met inclusion and exclusion criteria.  The informed consent form, study requirements and expectations were reviewed with the subject and questions and concerns were addressed prior to the signing of the consent form.  The subject verbalized understanding of the trail requirements.  The subject agreed to participate in the CAD FEM trial and signed the informed consent.  The informed consent was obtained prior to performance of any protocol-specific procedures for the subject.  A copy of the signed informed consent was given to the subject and a copy was placed in the subject's medical record.  Hedrick,Tammy W 06/25/2017, [id number]

## 2017-06-25 NOTE — Discharge Instructions (Signed)
Radial Site Care °Refer to this sheet in the next few weeks. These instructions provide you with information about caring for yourself after your procedure. Your health care provider may also give you more specific instructions. Your treatment has been planned according to current medical practices, but problems sometimes occur. Call your health care provider if you have any problems or questions after your procedure. °What can I expect after the procedure? °After your procedure, it is typical to have the following: °· Bruising at the radial site that usually fades within 1-2 weeks. °· Blood collecting in the tissue (hematoma) that may be painful to the touch. It should usually decrease in size and tenderness within 1-2 weeks. ° °Follow these instructions at home: °· Take medicines only as directed by your health care provider. °· You may shower 24-48 hours after the procedure or as directed by your health care provider. Remove the bandage (dressing) and gently wash the site with plain soap and water. Pat the area dry with a clean towel. Do not rub the site, because this may cause bleeding. °· Do not take baths, swim, or use a hot tub until your health care provider approves. °· Check your insertion site every day for redness, swelling, or drainage. °· Do not apply powder or lotion to the site. °· Do not flex or bend the affected arm for 24 hours or as directed by your health care provider. °· Do not push or pull heavy objects with the affected arm for 24 hours or as directed by your health care provider. °· Do not lift over 10 lb (4.5 kg) for 5 days after your procedure or as directed by your health care provider. °· Ask your health care provider when it is okay to: °? Return to work or school. °? Resume usual physical activities or sports. °? Resume sexual activity. °· Do not drive home if you are discharged the same day as the procedure. Have someone else drive you. °· You may drive 24 hours after the procedure  unless otherwise instructed by your health care provider. °· Do not operate machinery or power tools for 24 hours after the procedure. °· If your procedure was done as an outpatient procedure, which means that you went home the same day as your procedure, a responsible adult should be with you for the first 24 hours after you arrive home. °· Keep all follow-up visits as directed by your health care provider. This is important. °Contact a health care provider if: °· You have a fever. °· You have chills. °· You have increased bleeding from the radial site. Hold pressure on the site. °Get help right away if: °· You have unusual pain at the radial site. °· You have redness, warmth, or swelling at the radial site. °· You have drainage (other than a small amount of blood on the dressing) from the radial site. °· The radial site is bleeding, and the bleeding does not stop after 30 minutes of holding steady pressure on the site. °· Your arm or hand becomes pale, cool, tingly, or numb. °This information is not intended to replace advice given to you by your health care provider. Make sure you discuss any questions you have with your health care provider. °Document Released: 06/24/2010 Document Revised: 10/28/2015 Document Reviewed: 12/08/2013 °Elsevier Interactive Patient Education © 2018 Elsevier Inc[employer] ° ° ° °Moderate Conscious Sedation, Adult, Care After °These instructions provide you with information about caring for yourself after your procedure. Your health care provider   may also give you more specific instructions. Your treatment has been planned according to current medical practices, but problems sometimes occur. Call your health care provider if you have any problems or questions after your procedure. °What can I expect after the procedure? °After your procedure, it is common: °· To feel sleepy for several hours. °· To feel clumsy and have poor balance for several hours. °· To have poor judgment for several  hours. °· To vomit if you eat too soon. ° °Follow these instructions at home: °For at least 24 hours after the procedure: ° °· Do not: °? Participate in activities where you could fall or become injured. °? Drive. °? Use heavy machinery. °? Drink alcohol. °? Take sleeping pills or medicines that cause drowsiness. °? Make important decisions or sign legal documents. °? Take care of children on your own. °· Rest. °Eating and drinking °· Follow the diet recommended by your health care provider. °· If you vomit: °? Drink water, juice, or soup when you can drink without vomiting. °? Make sure you have little or no nausea before eating solid foods. °General instructions °· Have a responsible adult stay with you until you are awake and alert. °· Take over-the-counter and prescription medicines only as told by your health care provider. °· If you smoke, do not smoke without supervision. °· Keep all follow-up visits as told by your health care provider. This is important. °Contact a health care provider if: °· You keep feeling nauseous or you keep vomiting. °· You feel light-headed. °· You develop a rash. °· You have a fever. °Get help right away if: °· You have trouble breathing. °This information is not intended to replace advice given to you by your health care provider. Make sure you discuss any questions you have with your health care provider. °Document Released: 03/12/2013 Document Revised: 10/25/2015 Document Reviewed: 09/11/2015 °Elsevier Interactive Patient Education © 2018 Elsevier Inc. ° °

## 2017-06-26 ENCOUNTER — Encounter (HOSPITAL_COMMUNITY): Payer: Self-pay | Admitting: Cardiology

## 2017-06-28 ENCOUNTER — Ambulatory Visit (INDEPENDENT_AMBULATORY_CARE_PROVIDER_SITE_OTHER): Payer: Medicare Other | Admitting: Family

## 2017-06-28 ENCOUNTER — Encounter: Payer: Self-pay | Admitting: Family

## 2017-06-28 VITALS — BP 136/62 | HR 91 | Temp 98.3°F | Resp 16 | Ht 62.0 in | Wt 141.8 lb

## 2017-06-28 DIAGNOSIS — R0789 Other chest pain: Secondary | ICD-10-CM

## 2017-06-28 DIAGNOSIS — M13842 Other specified arthritis, left hand: Secondary | ICD-10-CM | POA: Diagnosis not present

## 2017-06-28 DIAGNOSIS — K219 Gastro-esophageal reflux disease without esophagitis: Secondary | ICD-10-CM | POA: Diagnosis not present

## 2017-06-28 DIAGNOSIS — I209 Angina pectoris, unspecified: Secondary | ICD-10-CM

## 2017-06-28 DIAGNOSIS — M13841 Other specified arthritis, right hand: Secondary | ICD-10-CM | POA: Diagnosis not present

## 2017-06-28 NOTE — Progress Notes (Signed)
Subjective:    Patient ID: Samantha Landry, female    DOB: Mar 03, 1946, 72 y.o.   MRN: [id number]  HPI  Samantha Landry is a 72 yr old female who presents today for follow up of her chest pain.  She was last seen on 05/16/17 and was referred for a stress test. Stress test was abnormal and we set her up with cardiology. Cardiology completed a cardiac cath earlier this week which was clean. Reports one episode of CP in early January.  None since.   BP Readings from Last 3 Encounters:  06/28/17 136/62  06/25/17 (!) 118/52  06/19/17 (!) 122/52     Review of Systems See HPI  Past Medical History:  Diagnosis Date  . Allergy   . Alopecia 08/03/2011  . Anxiety   . Anxiety and depression 11/25/2015  . Arthritis   . Benign neoplasm of skin of trunk 11/14/2010  . Cancer ([place])   . Cataract   . Diverticulosis of colon   . DIVERTICULOSIS, COLON 03/26/2007   Qualifier: Diagnosis of  By: Wynona Luna   . GERD (gastroesophageal reflux disease)   . Heart murmur   . HOT FLASHES 03/25/2007   Qualifier: Diagnosis of  By: Wynona Luna   . Hyperlipidemia   . Melanoma in situ ([place]) 02/2007   right arm  . Osteopenia 08/20/2012  . PULMONARY NODULE 03/25/2007   Qualifier: Diagnosis of  By: Wynona Luna   . Pulmonary nodules 02/2007   2-24mm  . SKIN CANCER, HX OF 03/26/2007   Qualifier: Diagnosis of  By: Wynona Luna      Social History   Socioeconomic History  . Marital status: Married    Spouse name: Not on file  . Number of children: 2  . Years of education: Not on file  . Highest education level: Not on file  Social Needs  . Financial resource strain: Not on file  . Food insecurity - worry: Not on file  . Food insecurity - inability: Not on file  . Transportation needs - medical: Not on file  . Transportation needs - non-medical: Not on file  Occupational History  . Occupation: retired    Comment: Primary school teacher  Tobacco Use  . Smoking status: Never Smoker  .  Smokeless tobacco: Never Used  Substance and Sexual Activity  . Alcohol use: Yes    Comment: 4-5 glasses of wine per week  . Drug use: No  . Sexual activity: No  Other Topics Concern  . 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 BUNIONECTOMY Left   . COLONOSCOPY    . DILATION AND CURETTAGE OF UTERUS  2002   uterine fibroids  . LEFT HEART CATH AND CORONARY ANGIOGRAPHY N/A 06/25/2017   Procedure: LEFT HEART CATH AND CORONARY ANGIOGRAPHY;  Surgeon: Leonie Man, MD;  Location: [facility];  Service: Cardiovascular;  Laterality: N/A;  . TONSILLECTOMY      Family History  Problem Relation Age of Onset  . Lung cancer Father   . Colon cancer Cousin        1st pat.cousin  . Rectal cancer Neg Hx   . Stomach cancer Neg Hx   . Esophageal cancer Neg Hx   .  Pancreatic cancer Neg Hx     Allergies  Allergen Reactions  . Codeine Nausea And Vomiting    REACTION: Jittery    Current Outpatient Medications on File Prior to Visit  Medication Sig Dispense Refill  . acetaminophen (TYLENOL) 325 MG tablet Take 650 mg by mouth every 6 (six) hours as needed for mild pain or moderate pain.    Marland Kitchen aspirin 81 MG tablet Take 81 mg by mouth daily.      Marland Kitchen atorvastatin (LIPITOR) 20 MG tablet TAKE 1 TABLET BY MOUTH ONCE DAILY (Patient taking differently: TAKE 1 TABLET BY MOUTH ONCE IN THE EVENING) 90 tablet 0  . Calcium-Vitamin D-Vitamin K 315-400-86 MG-UNT-MCG TABS Take 1 tablet by mouth daily.    . diclofenac sodium (VOLTAREN) 1 % GEL Apply 2 g topically 4 (four) times daily as needed.     Marland Kitchen escitalopram (LEXAPRO) 10 MG tablet TAKE 1 TABLET BY MOUTH ONCE DAILY 90 tablet 1  . fish oil-omega-3 fatty acids 1000 MG capsule Take 1 g by mouth daily.     . Melatonin 1 MG TABS Take 1 mg by mouth at bedtime as needed  (SLEEP).    . nitroGLYCERIN (NITROSTAT) 0.4 MG SL tablet Place 1 tablet (0.4 mg total) under the tongue every 5 (five) minutes as needed for chest pain. 11 tablet 6  . omeprazole (PRILOSEC) 40 MG capsule Take 1 capsule (40 mg total) by mouth daily. 30 capsule 3  . psyllium (REGULOID) 0.52 g capsule Take 0.52 g by mouth at bedtime. [employer]     Current Facility-Administered Medications on File Prior to Visit  Medication Dose Route Frequency Provider Last Rate Last Dose  . 0.9 %  sodium chloride infusion  500 mL Intravenous Once Danis, Estill Cotta III, MD        BP 136/62 (BP Location: Right Arm, Patient Position: Sitting, Cuff Size: Small)   Pulse 91   Temp 98.3 F (36.8 C) (Oral)   Resp 16   Ht 5\' 2"  (1.575 m)   Wt 141 lb 12.8 oz (64.3 kg)   SpO2 98%   BMI 25.94 kg/m       Objective:   Physical Exam  Constitutional: She is oriented to person, place, and time. She appears well-developed and well-nourished.  Cardiovascular: Normal rate, regular rhythm and normal heart sounds.  No murmur heard. Pulmonary/Chest: Effort normal and breath sounds normal. No respiratory distress. She has no wheezes.  Musculoskeletal: She exhibits no edema.  Neurological: She is alert and oriented to person, place, and time.  Psychiatric: She has a normal mood and affect. Her behavior is normal. Judgment and thought content normal.          Assessment & Plan:  Atypical chest pain- work up negative. She is concerned about hx of murmur.  I do not appreciate a murmur on today's exam but advised her to discuss with cardiology at her follow up visit to see if they would like for her to complete an echocardiogram.   GERD-I suspect her CP is gerd related- advised pt to continue PPI.

## 2017-06-28 NOTE — Patient Instructions (Addendum)
Please keep your upcoming appointment with cardiology.

## 2017-07-11 DIAGNOSIS — H2512 Age-related nuclear cataract, left eye: Secondary | ICD-10-CM | POA: Diagnosis not present

## 2017-07-11 DIAGNOSIS — H25011 Cortical age-related cataract, right eye: Secondary | ICD-10-CM | POA: Diagnosis not present

## 2017-07-11 DIAGNOSIS — H25012 Cortical age-related cataract, left eye: Secondary | ICD-10-CM | POA: Diagnosis not present

## 2017-07-11 DIAGNOSIS — H2511 Age-related nuclear cataract, right eye: Secondary | ICD-10-CM | POA: Diagnosis not present

## 2017-07-23 ENCOUNTER — Ambulatory Visit (INDEPENDENT_AMBULATORY_CARE_PROVIDER_SITE_OTHER): Payer: Medicare Other | Admitting: Cardiology

## 2017-07-23 ENCOUNTER — Encounter: Payer: Self-pay | Admitting: Cardiology

## 2017-07-23 VITALS — BP 118/72 | HR 60 | Ht 62.0 in | Wt 140.1 lb

## 2017-07-23 DIAGNOSIS — R9439 Abnormal result of other cardiovascular function study: Secondary | ICD-10-CM | POA: Diagnosis not present

## 2017-07-23 DIAGNOSIS — R0789 Other chest pain: Secondary | ICD-10-CM

## 2017-07-23 NOTE — Progress Notes (Signed)
Cardiology Office Note:    Date:  07/23/2017   ID:  Samantha Landry, DOB June 29, 1945, MRN [id number]  PCP:  Debbrah Alar, NP  Cardiologist:  Jenean Lindau, MD   Referring MD: Debbrah Alar, NP    ASSESSMENT:    1. Abnormal stress test   2. Atypical chest pain    PLAN:    In order of problems listed above:  1. Primary prevention stressed to the patient.  Importance of compliance with diet and medications stressed and she vocalized understanding. 2. I discussed coronary angiography report with her.  I told her to discuss with her primary care provider about reducing statin dose if felt appropriate.  This is in view of the new findings of the coronary angiography. 3. She was advised that she will be seen in follow-up appointment on a as needed basis only.   Medication Adjustments/Labs and Tests Ordered: Current medicines are reviewed at length with the patient today.  Concerns regarding medicines are outlined above.  No orders of the defined types were placed in this encounter.  No orders of the defined types were placed in this encounter.    Chief Complaint  Patient presents with  . Follow-up     History of Present Illness:    Samantha Landry is a 72 y.o. female.  The patient was evaluated by me for chest pain and her stress test was abnormal for which she underwent coronary angiography.  Coronary angiography revealed anatomically normal-appearing coronary arteries and subsequently she is doing fine.  No chest pain orthopnea or PND.  She is very happy with the results of the test  Past Medical History:  Diagnosis Date  . Allergy   . Alopecia 08/03/2011  . Anxiety   . Anxiety and depression 11/25/2015  . Arthritis   . Benign neoplasm of skin of trunk 11/14/2010  . Cancer ([place])   . Cataract   . Diverticulosis of colon   . DIVERTICULOSIS, COLON 03/26/2007   Qualifier: Diagnosis of  By: Wynona Luna   . GERD (gastroesophageal reflux disease)   . Heart  murmur   . HOT FLASHES 03/25/2007   Qualifier: Diagnosis of  By: Wynona Luna   . Hyperlipidemia   . Melanoma in situ ([place]) 02/2007   right arm  . Osteopenia 08/20/2012  . PULMONARY NODULE 03/25/2007   Qualifier: Diagnosis of  By: Wynona Luna   . Pulmonary nodules 02/2007   2-44mm  . SKIN CANCER, HX OF 03/26/2007   Qualifier: Diagnosis of  By: Wynona Luna     Past Surgical History:  Procedure Laterality Date  . BUNIONECTOMY Left   . COLONOSCOPY    . DILATION AND CURETTAGE OF UTERUS  2002   uterine fibroids  . LEFT HEART CATH AND CORONARY ANGIOGRAPHY N/A 06/25/2017   Procedure: LEFT HEART CATH AND CORONARY ANGIOGRAPHY;  Surgeon: Leonie Man, MD;  Location: [facility];  Service: Cardiovascular;  Laterality: N/A;  . TONSILLECTOMY      Current Medications: Current Meds  Medication Sig  . acetaminophen (TYLENOL) 325 MG tablet Take 650 mg by mouth every 6 (six) hours as needed for mild pain or moderate pain.  Marland Kitchen aspirin 81 MG tablet Take 81 mg by mouth daily.    Marland Kitchen atorvastatin (LIPITOR) 20 MG tablet TAKE 1 TABLET BY MOUTH ONCE DAILY (Patient taking differently: TAKE 1 TABLET BY MOUTH ONCE IN THE EVENING)  . Calcium-Vitamin D-Vitamin K 425-956-38 MG-UNT-MCG  TABS Take 1 tablet by mouth daily.  . diclofenac sodium (VOLTAREN) 1 % GEL Apply 2 g topically 4 (four) times daily as needed.   Marland Kitchen escitalopram (LEXAPRO) 10 MG tablet TAKE 1 TABLET BY MOUTH ONCE DAILY  . fish oil-omega-3 fatty acids 1000 MG capsule Take 1 g by mouth daily.   . Melatonin 1 MG TABS Take 1 mg by mouth at bedtime as needed (SLEEP).  . nitroGLYCERIN (NITROSTAT) 0.4 MG SL tablet Place 1 tablet (0.4 mg total) under the tongue every 5 (five) minutes as needed for chest pain.  Marland Kitchen omeprazole (PRILOSEC) 40 MG capsule Take 1 capsule (40 mg total) by mouth daily.  . psyllium (REGULOID) 0.52 g capsule Take 0.52 g by mouth at bedtime. [employer]   Current Facility-Administered Medications for the  07/23/17 encounter (Office Visit) with Revankar, Reita Cliche, MD  Medication  . 0.9 %  sodium chloride infusion     Allergies:   Codeine   Social History   Socioeconomic History  . Marital status: Married    Spouse name: None  . Number of children: 2  . Years of education: None  . Highest education level: None  Social Needs  . Financial resource strain: None  . Food insecurity - worry: None  . Food insecurity - inability: None  . Transportation needs - medical: None  . Transportation needs - non-medical: None  Occupational History  . Occupation: retired    Comment: Primary school teacher  Tobacco Use  . Smoking status: Never Smoker  . Smokeless tobacco: Never Used  Substance and Sexual Activity  . Alcohol use: Yes    Comment: 4-5 glasses of wine per week  . Drug use: No  . Sexual activity: No  Other Topics Concern  . 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 There is no history of Rectal cancer, Stomach cancer, Esophageal cancer, or Pancreatic cancer.  ROS:   Please see the history of present illness.    All other systems reviewed and are negative.  EKGs/Labs/Other Studies Reviewed:    The following studies were reviewed today: I discussed the findings of the coronary angiography with the patient at extensive length.   Recent Labs: 10/31/2016: TSH 0.67 05/16/2017: ALT 14 06/25/2017: BUN 13; Creatinine, Ser 0.68; Hemoglobin 13.3; Platelets 229; Potassium 4.1; Sodium 141  Recent Lipid Panel    Component Value Date/Time   CHOL 138 10/31/2016 1156   TRIG 169.0 (H) 10/31/2016 1156   HDL 47.40 10/31/2016 1156   CHOLHDL 3 10/31/2016 1156   VLDL 33.8 10/31/2016 1156   LDLCALC 57 10/31/2016 1156   LDLDIRECT 142.9 04/04/2007 1144    Physical  Exam:    VS:  BP 118/72 (BP Location: Right Arm, Patient Position: Sitting, Cuff Size: Normal)   Pulse 60   Ht 5\' 2"  (1.575 m)   Wt 140 lb 1.9 oz (63.6 kg)   SpO2 98%   BMI 25.63 kg/m     Wt Readings from Last 3 Encounters:  07/23/17 140 lb 1.9 oz (63.6 kg)  06/28/17 141 lb 12.8 oz (64.3 kg)  06/25/17 140 lb (63.5 kg)     GEN: Patient is in no acute distress HEENT: Normal NECK: No JVD; No  carotid bruits LYMPHATICS: No lymphadenopathy CARDIAC: Hear sounds regular, 2/6 systolic murmur at the apex. RESPIRATORY:  Clear to auscultation without rales, wheezing or rhonchi  ABDOMEN: Soft, non-tender, non-distended MUSCULOSKELETAL:  No edema; No deformity  SKIN: Warm and dry NEUROLOGIC:  Alert and oriented x 3 PSYCHIATRIC:  Normal affect   Signed, Jenean Lindau, MD  07/23/2017 9:55 AM    [address]

## 2017-07-23 NOTE — Patient Instructions (Signed)
Medication Instructions:  Your physician recommends that you continue on your current medications as directed. Please refer to the Current Medication list given to you today.   Labwork: None  Testing/Procedures: None  Follow-Up: Your physician recommends that you schedule a follow-up appointment in: as needed   Any Other Special Instructions Will Be Listed Below (If Applicable).     If you need a refill on your cardiac medications before your next appointment, please call your pharmacy.   [facility]  Ashley A, RN, BSN  

## 2017-07-26 DIAGNOSIS — M180 Bilateral primary osteoarthritis of first carpometacarpal joints: Secondary | ICD-10-CM | POA: Diagnosis not present

## 2017-07-26 DIAGNOSIS — M189 Osteoarthritis of first carpometacarpal joint, unspecified: Secondary | ICD-10-CM | POA: Diagnosis not present

## 2017-07-26 DIAGNOSIS — M1811 Unilateral primary osteoarthritis of first carpometacarpal joint, right hand: Secondary | ICD-10-CM | POA: Diagnosis not present

## 2017-08-01 DIAGNOSIS — H25012 Cortical age-related cataract, left eye: Secondary | ICD-10-CM | POA: Diagnosis not present

## 2017-08-01 DIAGNOSIS — H25011 Cortical age-related cataract, right eye: Secondary | ICD-10-CM | POA: Diagnosis not present

## 2017-08-01 DIAGNOSIS — H2512 Age-related nuclear cataract, left eye: Secondary | ICD-10-CM | POA: Diagnosis not present

## 2017-08-06 ENCOUNTER — Other Ambulatory Visit: Payer: Self-pay | Admitting: Family

## 2017-08-06 ENCOUNTER — Encounter: Payer: Self-pay | Admitting: Family

## 2017-08-07 MED ORDER — ATORVASTATIN CALCIUM 20 MG PO TABS: 20.0000 mg | ORAL_TABLET | Freq: Every day | ORAL | 1 refills | Status: DC

## 2017-08-08 DIAGNOSIS — H2511 Age-related nuclear cataract, right eye: Secondary | ICD-10-CM | POA: Diagnosis not present

## 2017-08-08 DIAGNOSIS — H25011 Cortical age-related cataract, right eye: Secondary | ICD-10-CM | POA: Diagnosis not present

## 2017-08-15 ENCOUNTER — Ambulatory Visit (INDEPENDENT_AMBULATORY_CARE_PROVIDER_SITE_OTHER): Payer: Medicare Other | Admitting: Internal Medicine

## 2017-08-15 ENCOUNTER — Encounter: Payer: Self-pay | Admitting: Internal Medicine

## 2017-08-15 ENCOUNTER — Ambulatory Visit: Payer: Self-pay | Admitting: *Deleted

## 2017-08-15 VITALS — BP 116/76 | HR 74 | Temp 97.6°F | Resp 14 | Ht 62.0 in | Wt 138.4 lb

## 2017-08-15 DIAGNOSIS — I209 Angina pectoris, unspecified: Secondary | ICD-10-CM | POA: Diagnosis not present

## 2017-08-15 DIAGNOSIS — K529 Noninfective gastroenteritis and colitis, unspecified: Secondary | ICD-10-CM

## 2017-08-15 MED ORDER — ONDANSETRON HCL 4 MG PO TABS: 4.0000 mg | ORAL_TABLET | Freq: Three times a day (TID) | ORAL | 0 refills | Status: DC | PRN

## 2017-08-15 NOTE — Progress Notes (Signed)
Subjective:    Patient ID: Samantha Landry, female    DOB: 1946/05/27, 72 y.o.   MRN: [id number]  DOS:  08/15/2017 Type of visit - description : Acute visit Interval history:  Symptoms started 08/11/2016: Had diarrhea for 48 hours.  Appetite was poor.  Stools were watery but nonbloody. She tried Imodium without much help. Then she developed soft BMs, had a lot of "indigestion" characterized by burping, increased flatus.  Had some nausea as well. Last night, she had watery diarrhea again few times. She has been able to drink plenty of fluids and feels like she has keep herself hydrated.  Review of Systems  Denies fever chills No vomiting or actual abdominal pain. No recent antibiotics by mouth No sick contacts She did feel slightly dizzy one time 2 days ago.  Not today.   Past Medical History:  Diagnosis Date  . Allergy   . Alopecia 08/03/2011  . Anxiety   . Anxiety and depression 11/25/2015  . Arthritis   . Benign neoplasm of skin of trunk 11/14/2010  . Cancer ([place])   . Cataract   . Diverticulosis of colon   . DIVERTICULOSIS, COLON 03/26/2007   Qualifier: Diagnosis of  By: Wynona Luna   . GERD (gastroesophageal reflux disease)   . Heart murmur   . HOT FLASHES 03/25/2007   Qualifier: Diagnosis of  By: Wynona Luna   . Hyperlipidemia   . Melanoma in situ ([place]) 02/2007   right arm  . Osteopenia 08/20/2012  . PULMONARY NODULE 03/25/2007   Qualifier: Diagnosis of  By: Wynona Luna   . Pulmonary nodules 02/2007   2-39mm  . SKIN CANCER, HX OF 03/26/2007   Qualifier: Diagnosis of  By: Wynona Luna     Past Surgical History:  Procedure Laterality Date  . BUNIONECTOMY Left   . COLONOSCOPY    . DILATION AND CURETTAGE OF UTERUS  2002   uterine fibroids  . EYE SURGERY Bilateral 2019  . LEFT HEART CATH AND CORONARY ANGIOGRAPHY N/A 06/25/2017   Procedure: LEFT HEART CATH AND CORONARY ANGIOGRAPHY;  Surgeon: Leonie Man, MD;  Location: [facility];   Service: Cardiovascular;  Laterality: N/A;  . TONSILLECTOMY      Social History   Socioeconomic History  . Marital status: Married    Spouse name: Not on file  . Number of children: 2  . Years of education: Not on file  . Highest education level: Not on file  Social Needs  . Financial resource strain: Not on file  . Food insecurity - worry: Not on file  . Food insecurity - inability: Not on file  . Transportation needs - medical: Not on file  . Transportation needs - non-medical: Not on file  Occupational History  . Occupation: retired    Comment: Primary school teacher  Tobacco Use  . Smoking status: Never Smoker  . Smokeless tobacco: Never Used  Substance and Sexual Activity  . Alcohol use: Yes    Comment: 4-5 glasses of wine per week  . Drug use: No  . Sexual activity: No  Other Topics Concern  . 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 Always use your most recent med list.          acetaminophen 325 MG tablet Commonly known as:  TYLENOL Take 650 mg by mouth every 6 (six) hours as needed for mild pain or moderate pain.   aspirin 81 MG tablet Take 81 mg by mouth daily.   atorvastatin 20 MG tablet Commonly known as:  LIPITOR Take 1 tablet (20 mg total) by mouth daily.   Calcium-Vitamin D-Vitamin K 431-540-08 MG-UNT-MCG Tabs Take 1 tablet by mouth daily.   diclofenac sodium 1 % Gel Commonly known as:  VOLTAREN Apply 2 g topically 4 (four) times daily as needed.   escitalopram 10 MG tablet Commonly known as:  LEXAPRO TAKE 1 TABLET BY MOUTH ONCE DAILY   fish oil-omega-3 fatty acids 1000 MG capsule Take 1 g by mouth daily.   Melatonin 1 MG Tabs Take 1 mg by mouth at  bedtime as needed (SLEEP).   nitroGLYCERIN 0.4 MG SL tablet Commonly known as:  NITROSTAT Place 1 tablet (0.4 mg total) under the tongue every 5 (five) minutes as needed for chest pain.   omeprazole 40 MG capsule Commonly known as:  PRILOSEC Take 1 capsule (40 mg total) by mouth daily.   ondansetron 4 MG tablet Commonly known as:  ZOFRAN Take 1 tablet (4 mg total) by mouth every 8 (eight) hours as needed for nausea or vomiting.   psyllium 0.52 g capsule Commonly known as:  REGULOID Take 0.52 g by mouth at bedtime. [employer]          Objective:   Physical Exam BP 116/76 (BP Location: Left Arm, Patient Position: Sitting, Cuff Size: Small)   Pulse 74   Temp 97.6 F (36.4 C) (Oral)   Resp 14   Ht 5\' 2"  (1.575 m)   Wt 138 lb 6 oz (62.8 kg)   SpO2 97%   BMI 25.31 kg/m  General:   Well developed, well nourished . NAD.  HEENT:  Normocephalic . Face symmetric, atraumatic.  Not pale or jaundiced Lungs:  CTA B Normal respiratory effort, no intercostal retractions, no accessory muscle use. Heart: RRR,  no murmur.  no pretibial edema bilaterally  Abdomen:  Not distended, soft, non-tender. No rebound or rigidity.   Skin: Not pale. Not jaundice Neurologic:  alert & oriented X3.  Speech normal, gait appropriate for age and unassisted Psych--  Cognition and judgment appear intact.  Cooperative with normal attention span and concentration.  Behavior appropriate. No anxious or depressed appearing.     Assessment & Plan:   72 year old female with medical history that includes high cholesterol, pulmonary nodule, osteopenia, anxiety depression presents with Gastroenteritis: GI symptoms for the last 4 days likely from gastroenteritis, recommend hydration, bland diet, Zofran as needed, Pepto-Bismol OTC.  Call if not better, warning symptoms discussed.

## 2017-08-15 NOTE — Telephone Encounter (Signed)
Called in c/o having diarrhea that started Saturday night.  Been using Imodium but it's not helping much.   She's able to keep liquids down and is eating popsicles.  See triage notes below.  In the notes it's mentioned she does not drive but she DOES drive so transportation is not a problem. Reason for Disposition . [1] MODERATE diarrhea (e.g., 4-6 times / day more than normal) AND [2] age > 70 years  Answer Assessment - Initial Assessment Questions 1. DIARRHEA SEVERITY: "How bad is the diarrhea?" "How many extra stools have you had in the past 24 hours than normal?"    - MILD: Few loose or mushy BMs; increase of 1-3 stools over normal daily number of stools; mild increase in ostomy output.   - MODERATE: Increase of 4-6 stools daily over normal; moderate increase in ostomy output.   - SEVERE (or Worst Possible): Increase of 7 or more stools daily over normal; moderate increase in ostomy output; incontinence.     Saturday midnight it started.  It eased off about 4:00PM    Severe diarrhea.  No vomiting.   I've been using   Monday and yesterday a lot of gas and indigestion.   No cramping.   No one can bring me in.   I don't drive anymore.   I'm using Imodium and it's not helping. 2. ONSET: "When did the diarrhea begin?"      See above 3. BM CONSISTENCY: "How loose or watery is the diarrhea?"      I'm not eating anything because I'm afraid to.    It has pieces in it but towards the end mostly liquid.   No blood.    I just had a colonoscopy at first of the year and I was fine. 4. VOMITING: "Are you also vomiting?" If so, ask: "How many times in the past 24 hours?"      I ate on Monday.   Eggs and toast for breakfast.   The diarrhea started again last night.    5. ABDOMINAL PAIN: "Are you having any abdominal pain?" If yes: "What does it feel like?" (e.g., crampy, dull, intermittent, constant)      No cramping  6. ABDOMINAL PAIN SEVERITY: If present, ask: "How bad is the pain?"  (e.g., Scale 1-10;  mild, moderate, or severe)    - MILD (1-3): doesn't interfere with normal activities, abdomen soft and not tender to touch     - MODERATE (4-7): interferes with normal activities or awakens from sleep, tender to touch     - SEVERE (8-10): excruciating pain, doubled over, unable to do any normal activities       No pain or cramping 7. ORAL INTAKE: If vomiting, "Have you been able to drink liquids?" "How much fluids have you had in the past 24 hours?"     A lot of gas pressure.   I'm eating popsicles and drinking fluids I know that is important. 8. HYDRATION: "Any signs of dehydration?" (e.g., dry mouth [not just dry lips], too weak to stand, dizziness, new weight loss) "When did you last urinate?"     None of the above. 9. EXPOSURE: "Have you traveled to a foreign country recently?" "Have you been exposed to anyone with diarrhea?" "Could you have eaten any food that was spoiled?"     No travels.   No exposure to anyone that she knows of. 10. OTHER SYMPTOMS: "Do you have any other symptoms?" (e.g., fever, blood in stool)  No blood.   No fever. 11. PREGNANCY: "Is there any chance you are pregnant?" "When was your last menstrual period?"       Not asked  Protocols used: DIARRHEA-A-AH

## 2017-08-15 NOTE — Telephone Encounter (Signed)
thx

## 2017-08-15 NOTE — Patient Instructions (Signed)
Rest   Drink plenty of fluids like water, Gatorade, 7-Up  Follow-up bland diet  Take Zofran as needed for nausea  Call if not gradually better  Call if symptoms severe or if you develop fever, blood in the stools.   Food Choices to Help Relieve Diarrhea, Adult  When you have diarrhea, the foods you eat and your eating habits are very important. Choosing the right foods and drinks can help:  Relieve diarrhea.  Replace lost fluids and nutrients.  Prevent dehydration.  What general guidelines should I follow? Relieving diarrhea  Choose foods with less than 2 g or .07 oz. of fiber per serving.  Limit fats to less than 8 tsp (38 g or 1.34 oz.) a day.  Avoid the following: ? Foods and beverages sweetened with high-fructose corn syrup, honey, or sugar alcohols such as xylitol, sorbitol, and mannitol. ? Foods that contain a lot of fat or sugar. ? Fried, greasy, or spicy foods. ? High-fiber grains, breads, and cereals. ? Raw fruits and vegetables.  Eat foods that are rich in probiotics. These foods include dairy products such as yogurt and fermented milk products. They help increase healthy bacteria in the stomach and intestines (gastrointestinal tract, or GI tract).  If you have lactose intolerance, avoid dairy products. These may make your diarrhea worse.  Take medicine to help stop diarrhea (antidiarrheal medicine) only as told by your health care provider. Replacing nutrients  Eat small meals or snacks every 3-4 hours.  Eat bland foods, such as white rice, toast, or baked potato, until your diarrhea starts to get better. Gradually reintroduce nutrient-rich foods as tolerated or as told by your health care provider. This includes: ? Well-cooked protein foods. ? Peeled, seeded, and soft-cooked fruits and vegetables. ? Low-fat dairy products.  Take vitamin and mineral supplements as told by your health care provider. Preventing dehydration   Start by sipping water or a  special solution to prevent dehydration (oral rehydration solution, ORS). Urine that is clear or pale yellow means that you are getting enough fluid.  Try to drink at least 8-10 cups of fluid each day to help replace lost fluids.  You may add other liquids in addition to water, such as clear juice or decaffeinated sports drinks, as tolerated or as told by your health care provider.  Avoid drinks with caffeine, such as coffee, tea, or soft drinks.  Avoid alcohol. What foods are recommended? The items listed may not be a complete list. Talk with your health care provider about what dietary choices are best for you. Grains White rice. White, [place], or pita breads (fresh or toasted), including plain rolls, buns, or bagels. White pasta. Saltine, soda, or graham crackers. Pretzels. Low-fiber cereal. Cooked cereals made with water (such as cornmeal, farina, or cream cereals). Plain muffins. Matzo. Melba toast. Zwieback. Vegetables Potatoes (without the skin). Most well-cooked and canned vegetables without skins or seeds. Tender lettuce. Fruits Apple sauce. Fruits canned in juice. Cooked apricots, cherries, grapefruit, peaches, pears, or plums. Fresh bananas and cantaloupe. Meats and other protein foods Baked or boiled chicken. Eggs. Tofu. Fish. Seafood. Smooth nut butters. Ground or well-cooked tender beef, ham, veal, lamb, pork, or poultry. Dairy Plain yogurt, kefir, and unsweetened liquid yogurt. Lactose-free milk, buttermilk, skim milk, or soy milk. Low-fat or nonfat hard cheese. Beverages Water. Low-calorie sports drinks. Fruit juices without pulp. Strained tomato and vegetable juices. Decaffeinated teas. Sugar-free beverages not sweetened with sugar alcohols. Oral rehydration solutions, if approved by your health care provider.  Seasoning and other foods Bouillon, broth, or soups made from recommended foods. What foods are not recommended? The items listed may not be a complete list. Talk  with your health care provider about what dietary choices are best for you. Grains Whole grain, whole wheat, bran, or rye breads, rolls, pastas, and crackers. Wild or brown rice. Whole grain or bran cereals. Barley. Oats and oatmeal. Corn tortillas or taco shells. Granola. Popcorn. Vegetables Raw vegetables. Fried vegetables. Cabbage, broccoli, Brussels sprouts, artichokes, baked beans, beet greens, corn, kale, legumes, peas, sweet potatoes, and yams. Potato skins. Cooked spinach and cabbage. Fruits Dried fruit, including raisins and dates. Raw fruits. Stewed or dried prunes. Canned fruits with syrup. Meat and other protein foods Fried or fatty meats. Deli meats. Chunky nut butters. Nuts and seeds. Beans and lentils. Berniece Salines. Hot dogs. Sausage. Dairy High-fat cheeses. Whole milk, chocolate milk, and beverages made with milk, such as milk shakes. Half-and-half. Cream. sour cream. Ice cream. Beverages Caffeinated beverages (such as coffee, tea, soda, or energy drinks). Alcoholic beverages. Fruit juices with pulp. Prune juice. Soft drinks sweetened with high-fructose corn syrup or sugar alcohols. High-calorie sports drinks. Fats and oils Butter. Cream sauces. Margarine. Salad oils. Plain salad dressings. Olives. Avocados. Mayonnaise. Sweets and desserts Sweet rolls, doughnuts, and sweet breads. Sugar-free desserts sweetened with sugar alcohols such as xylitol and sorbitol. Seasoning and other foods Honey. Hot sauce. Chili powder. Gravy. Cream-based or milk-based soups. Pancakes and waffles. Summary  When you have diarrhea, the foods you eat and your eating habits are very important.  Make sure you get at least 8-10 cups of fluid each day, or enough to keep your urine clear or pale yellow.  Eat bland foods and gradually reintroduce healthy, nutrient-rich foods as tolerated, or as told by your health care provider.  Avoid high-fiber, fried, greasy, or spicy foods. This information is not  intended to replace advice given to you by your health care provider. Make sure you discuss any questions you have with your health care provider. Document Released: 08/12/2003 Document Revised: 05/19/2016 Document Reviewed: 05/19/2016 Elsevier Interactive Patient Education  [employer].

## 2017-08-15 NOTE — Telephone Encounter (Signed)
Appt w/ Dr. Larose Kells later today. Sending for [place].

## 2017-08-15 NOTE — Progress Notes (Signed)
Pre visit review using our clinic review tool, if applicable. No additional management support is needed unless otherwise documented below in the visit note. 

## 2017-09-16 ENCOUNTER — Other Ambulatory Visit: Payer: Self-pay | Admitting: Family

## 2017-10-30 DIAGNOSIS — Z1231 Encounter for screening mammogram for malignant neoplasm of breast: Secondary | ICD-10-CM | POA: Diagnosis not present

## 2017-10-30 LAB — HM MAMMOGRAPHY

## 2017-11-01 ENCOUNTER — Ambulatory Visit: Payer: Medicare Other | Admitting: *Deleted

## 2017-11-02 ENCOUNTER — Telehealth: Payer: Self-pay | Admitting: *Deleted

## 2017-11-02 ENCOUNTER — Ambulatory Visit: Payer: Self-pay | Admitting: *Deleted

## 2017-11-02 NOTE — Telephone Encounter (Signed)
Received request for Medical Records from [facility] [place];last [3] OV notes faxed/SLS 05/31

## 2017-11-07 DIAGNOSIS — M1812 Unilateral primary osteoarthritis of first carpometacarpal joint, left hand: Secondary | ICD-10-CM | POA: Diagnosis not present

## 2017-11-07 DIAGNOSIS — G8918 Other acute postprocedural pain: Secondary | ICD-10-CM | POA: Diagnosis not present

## 2017-11-07 HISTORY — PX: OTHER SURGICAL HISTORY: SHX169

## 2017-11-08 ENCOUNTER — Encounter: Payer: Self-pay | Admitting: Family

## 2017-11-19 ENCOUNTER — Ambulatory Visit: Payer: Medicare Other | Admitting: *Deleted

## 2017-11-21 NOTE — Progress Notes (Signed)
Subjective:   Samantha Landry is a 72 y.o. female who presents for Medicare Annual (Subsequent) preventive examination.  Review of Systems: No ROS.  Medicare Wellness Visit. Additional risk factors are reflected in the social history. Cardiac Risk Factors include: none;dyslipidemia Sleep patterns: sleeps 7-8 hrs.  Home Safety/Smoke Alarms: Feels safe in home. Smoke alarms in place.  Living environment; residence and Firearm Safety: Lives with husband. No stairs   Female:   Pap-  10/19/15 normal Mammo-utd       Dexa scan-utd        CCS-06/21/17. No routine repeat recommended.     Objective:     Vitals: BP 124/60 (BP Location: Left Arm, Patient Position: Sitting, Cuff Size: Normal)   Pulse 74   Ht 5\' 2"  (1.575 m)   Wt 140 lb (63.5 kg)   SpO2 97%   BMI 25.61 kg/m   Body mass index is 25.61 kg/m.  Advanced Directives 11/26/2017 06/25/2017 11/01/2016  Does Patient Have a Medical Advance Directive? Yes Yes Yes  Type of Paramedic of [place];Living will - [place];Living will  Does patient want to make changes to medical advance directive? - No - Patient declined -  Copy of [place] in Chart? No - copy requested - No - copy requested    Tobacco Social History   Tobacco Use  Smoking Status Never Smoker  Smokeless Tobacco Never Used     Counseling given: Not Answered   Clinical Intake: Pain : No/denies pain     Past Medical History:  Diagnosis Date  . Allergy   . Alopecia 08/03/2011  . Anxiety   . Anxiety and depression 11/25/2015  . Arthritis   . Benign neoplasm of skin of trunk 11/14/2010  . Cancer ([facility])   . Cataract   . Diverticulosis of colon   . DIVERTICULOSIS, COLON 03/26/2007   Qualifier: Diagnosis of  By: Wynona Luna   . GERD (gastroesophageal reflux disease)   . Heart murmur   . HOT FLASHES 03/25/2007   Qualifier: Diagnosis of  By: Wynona Luna   . Hyperlipidemia   . Melanoma in  situ ([facility]) 02/2007   right arm  . Osteopenia 08/20/2012  . PULMONARY NODULE 03/25/2007   Qualifier: Diagnosis of  By: Wynona Luna   . Pulmonary nodules 02/2007   2-92mm  . SKIN CANCER, HX OF 03/26/2007   Qualifier: Diagnosis of  By: Wynona Luna    Past Surgical History:  Procedure Laterality Date  . bone spur Left 11/07/2017   wrist  . BUNIONECTOMY Left   . COLONOSCOPY    . DILATION AND CURETTAGE OF UTERUS  2002   uterine fibroids  . EYE SURGERY Bilateral 2019  . LEFT HEART CATH AND CORONARY ANGIOGRAPHY N/A 06/25/2017   Procedure: LEFT HEART CATH AND CORONARY ANGIOGRAPHY;  Surgeon: Leonie Man, MD;  Location: [facility];  Service: Cardiovascular;  Laterality: N/A;  . TONSILLECTOMY     Family History  Problem Relation Age of Onset  . Lung cancer Father   . Colon cancer Cousin        1st pat.cousin  . Rectal cancer Neg Hx   . Stomach cancer Neg Hx   . Esophageal cancer Neg Hx   . Pancreatic cancer Neg Hx    Social History   Socioeconomic History  . Marital status: Married    Spouse name: Not on file  .  Number of children: 2  . Years of education: Not on file  . Highest education level: Not on file  Occupational History  . Occupation: retired    Comment: Primary school teacher  Social Needs  . Financial resource strain: Not on file  . Food insecurity:    Worry: Not on file    Inability: Not on file  . Transportation needs:    Medical: Not on file    Non-medical: Not on file  Tobacco Use  . Smoking status: Never Smoker  . Smokeless tobacco: Never Used  Substance and Sexual Activity  . Alcohol use: Yes    Comment: 4-5 glasses of wine per week  . Drug use: No  . Sexual activity: Never  Lifestyle  . Physical activity:    Days per week: Not on file    Minutes per session: Not on file  . Stress: Not on file  Relationships  . Social connections:    Talks on phone: Not on file    Gets together: Not on file    Attends religious service: Not on  file    Active member of club or organization: Not on file    Attends meetings of clubs or organizations: Not on file    Relationship status: Not on file  Other Topics Concern  . 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 acetaminophen (TYLENOL) 325 MG tablet Take 650 mg by mouth every 6 (six) hours as needed for mild pain or moderate pain.  Marland Kitchen aspirin 81 MG tablet Take 81 mg by mouth daily.    Marland Kitchen atorvastatin (LIPITOR) 20 MG tablet Take 1 tablet (20 mg total) by mouth daily.  . Calcium-Vitamin D-Vitamin K [id number] MG-UNT-MCG TABS Take 1 tablet by mouth daily.  . diclofenac sodium (VOLTAREN) 1 % GEL Apply 2 g topically 4 (four) times daily as needed.   Marland Kitchen escitalopram (LEXAPRO) 10 MG tablet TAKE 1 TABLET BY MOUTH ONCE DAILY  . fish oil-omega-3 fatty acids 1000 MG capsule Take 1 g by mouth daily.   Marland Kitchen omeprazole (PRILOSEC) 40 MG capsule TAKE 1 CAPSULE BY MOUTH ONCE DAILY  . ondansetron (ZOFRAN) 4 MG tablet Take 1 tablet (4 mg total) by mouth every 8 (eight) hours as needed for nausea or vomiting.  . psyllium (REGULOID) 0.52 g capsule Take 0.52 g by mouth at bedtime. [employer]  . Melatonin 1 MG TABS Take 1 mg by mouth at bedtime as needed (SLEEP).  . nitroGLYCERIN (NITROSTAT) 0.4 MG SL tablet Place 1 tablet (0.4 mg total) under the tongue every 5 (five) minutes as needed for chest pain. (Patient not taking: Reported on 08/15/2017)   Facility-Administered Encounter Medications as of 11/26/2017  Medication  . 0.9 %  sodium chloride infusion    Activities of Daily Living In your present state of health, do you have any difficulty performing the following activities: 11/26/2017 06/25/2017  Hearing? N N  Vision? N N  Difficulty concentrating or making decisions? N N  Walking or climbing stairs?  N Y  Dressing or bathing? N N  Doing errands, shopping? N -  Preparing Food and eating ? N -  Using the Toilet? N -  In the past six months, have you accidently leaked urine? N -  Do you have problems with loss of bowel control? N -  Managing your Medications? N -  Managing your Finances? N -  Housekeeping or managing your Housekeeping? N -  Some recent data might be hidden    Patient Care Team: Debbrah Alar, NP as PCP - General (Internal Medicine) Delila Pereyra, MD as Consulting Physician (Gynecology)    Assessment:   This is a routine wellness examination for Rachael. Physical assessment deferred to PCP.  Exercise Activities and Dietary recommendations Current Exercise Habits: Home exercise routine, Type of exercise: walking, Time (Minutes): 60, Frequency (Times/Week): 6, Weekly Exercise (Minutes/Week): 360, Intensity: Mild Diet (meal preparation, eat out, water intake, caffeinated beverages, dairy products, fruits and vegetables): in general, a "healthy" diet  , well balanced   Goals    . Maintain current healthy lifestyle.    . Weight (lb) < 135 lb (61.2 kg)     With exercise and healthy diet.       Fall Risk Fall Risk  11/26/2017 11/01/2016 10/31/2016 10/19/2015 10/16/2014  Falls in the past year? No No No No No    Depression Screen PHQ 2/9 Scores 11/26/2017 11/01/2016 10/31/2016 03/29/2016  PHQ - 2 Score 0 0 1 0  PHQ- 9 Score - - 6 -     Cognitive Function Ad8 score reviewed for issues:  Issues making decisions:no  Less interest in hobbies / activities:no  Repeats questions, stories (family complaining):no  Trouble using ordinary gadgets (microwave, computer, phone):no  Forgets the month or year: no  Mismanaging finances: no  Remembering appts:no  Daily problems with thinking and/or memory:no Ad8 score is=0         Immunization History  Administered Date(s) Administered  . Influenza Split 03/05/2012  . Influenza Whole 03/22/2010  . Influenza,  High Dose Seasonal PF 02/23/2015, 02/25/2016  . Influenza-Unspecified 02/12/2017  . Pneumococcal Conjugate-13 09/22/2013  . Pneumococcal Polysaccharide-23 03/05/2012  . Tdap 10/26/2010  . Zoster 10/26/2010   Screening Tests Health Maintenance  Topic Date Due  . Hepatitis C Screening  08/22/1945  . INFLUENZA VACCINE  01/03/2018  . MAMMOGRAM  10/31/2019  . TETANUS/TDAP  10/25/2020  . DEXA SCAN  Completed  . PNA vac Low Risk Adult  Completed       Plan:   Please schedule your next medicare wellness visit with me in 1 yr.  Continue to eat heart healthy diet (full of fruits, vegetables, whole grains, lean protein, water--limit salt, fat, and sugar intake) and increase physical activity as tolerated.  Continue doing brain stimulating activities (puzzles, reading, adult coloring books, staying active) to keep memory sharp.   Bring a copy of your living will and/or healthcare power of attorney to your next office visit.    I have personally reviewed and noted the following in the patient's chart:   . Medical and social history . Use of alcohol, tobacco or illicit drugs  . Current medications and supplements . Functional ability and status . Nutritional status . Physical activity . Advanced directives . List of other physicians . Hospitalizations, surgeries, and ER visits in previous 12 months . Vitals . Screenings to include cognitive, depression, and falls . Referrals and appointments  In addition, I have reviewed and discussed with patient certain preventive protocols, quality metrics, and best practice recommendations. A written personalized care plan for preventive services as well as general preventive health recommendations were provided to patient.     Naaman Plummer [place], [place]  11/26/2017

## 2017-11-22 DIAGNOSIS — Z4789 Encounter for other orthopedic aftercare: Secondary | ICD-10-CM | POA: Diagnosis not present

## 2017-11-22 DIAGNOSIS — M1812 Unilateral primary osteoarthritis of first carpometacarpal joint, left hand: Secondary | ICD-10-CM | POA: Diagnosis not present

## 2017-11-26 ENCOUNTER — Other Ambulatory Visit: Payer: Self-pay | Admitting: Family

## 2017-11-26 ENCOUNTER — Encounter: Payer: Self-pay | Admitting: *Deleted

## 2017-11-26 ENCOUNTER — Ambulatory Visit (INDEPENDENT_AMBULATORY_CARE_PROVIDER_SITE_OTHER): Payer: Medicare Other | Admitting: *Deleted

## 2017-11-26 VITALS — BP 124/60 | HR 74 | Ht 62.0 in | Wt 140.0 lb

## 2017-11-26 DIAGNOSIS — Z0000 Encounter for general adult medical examination without abnormal findings: Secondary | ICD-10-CM | POA: Diagnosis not present

## 2017-11-26 NOTE — Patient Instructions (Signed)
Please schedule your next medicare wellness visit with me in 1 yr.  Continue to eat heart healthy diet (full of fruits, vegetables, whole grains, lean protein, water--limit salt, fat, and sugar intake) and increase physical activity as tolerated.  Continue doing brain stimulating activities (puzzles, reading, adult coloring books, staying active) to keep memory sharp.   Bring a copy of your living will and/or healthcare power of attorney to your next office visit.   Samantha Landry , Thank you for taking time to come for your Medicare Wellness Visit. I appreciate your ongoing commitment to your health goals. Please review the following plan we discussed and let me know if I can assist you in the future.   These are the goals we discussed: Goals    . Maintain current healthy lifestyle.    . Weight (lb) < 135 lb (61.2 kg)     With exercise and healthy diet.       This is a list of the screening recommended for you and due dates:  Health Maintenance  Topic Date Due  .  Hepatitis C: One time screening is recommended by Center for Disease Control  (CDC) for  adults born from [birth year] through 1964.   [birth year]  . Flu Shot  01/03/2018  . Mammogram  10/31/2019  . Tetanus Vaccine  10/25/2020  . DEXA scan (bone density measurement)  Completed  . Pneumonia vaccines  Completed     Health Maintenance for Postmenopausal Women Menopause is a normal process in which your reproductive ability comes to an end. This process happens gradually over a span of months to years, usually between the ages of 35 and 55. Menopause is complete when you have missed 12 consecutive menstrual periods. It is important to talk with your health care provider about some of the most common conditions that affect postmenopausal women, such as heart disease, cancer, and bone loss (osteoporosis). Adopting a healthy lifestyle and getting preventive care can help to promote your health and wellness. Those actions can also lower your  chances of developing some of these common conditions. What should I know about menopause? During menopause, you may experience a number of symptoms, such as:  Moderate-to-severe hot flashes.  Night sweats.  Decrease in sex drive.  Mood swings.  Headaches.  Tiredness.  Irritability.  Memory problems.  Insomnia.  Choosing to treat or not to treat menopausal changes is an individual decision that you make with your health care provider. What should I know about hormone replacement therapy and supplements? Hormone therapy products are effective for treating symptoms that are associated with menopause, such as hot flashes and night sweats. Hormone replacement carries certain risks, especially as you become older. If you are thinking about using estrogen or estrogen with progestin treatments, discuss the benefits and risks with your health care provider. What should I know about heart disease and stroke? Heart disease, heart attack, and stroke become more likely as you age. This may be due, in part, to the hormonal changes that your body experiences during menopause. These can affect how your body processes dietary fats, triglycerides, and cholesterol. Heart attack and stroke are both medical emergencies. There are many things that you can do to help prevent heart disease and stroke:  Have your blood pressure checked at least every 1-2 years. High blood pressure causes heart disease and increases the risk of stroke.  If you are 72-13 years old, ask your health care provider if you should take aspirin to prevent a heart  attack or a stroke.  Do not use any tobacco products, including cigarettes, chewing tobacco, or electronic cigarettes. If you need help quitting, ask your health care provider.  It is important to eat a healthy diet and maintain a healthy weight. ? Be sure to include plenty of vegetables, fruits, low-fat dairy products, and lean protein. ? Avoid eating foods that are  high in solid fats, added sugars, or salt (sodium).  Get regular exercise. This is one of the most important things that you can do for your health. ? Try to exercise for at least 150 minutes each week. The type of exercise that you do should increase your heart rate and make you sweat. This is known as moderate-intensity exercise. ? Try to do strengthening exercises at least twice each week. Do these in addition to the moderate-intensity exercise.  Know your numbers.Ask your health care provider to check your cholesterol and your blood glucose. Continue to have your blood tested as directed by your health care provider.  What should I know about cancer screening? There are several types of cancer. Take the following steps to reduce your risk and to catch any cancer development as early as possible. Breast Cancer  Practice breast self-awareness. ? This means understanding how your breasts normally appear and feel. ? It also means doing regular breast self-exams. Let your health care provider know about any changes, no matter how small.  If you are 22 or older, have a clinician do a breast exam (clinical breast exam or CBE) every year. Depending on your age, family history, and medical history, it may be recommended that you also have a yearly breast X-ray (mammogram).  If you have a family history of breast cancer, talk with your health care provider about genetic screening.  If you are at high risk for breast cancer, talk with your health care provider about having an MRI and a mammogram every year.  Breast cancer (BRCA) gene test is recommended for women who have family members with BRCA-related cancers. Results of the assessment will determine the need for genetic counseling and BRCA1 and for BRCA2 testing. BRCA-related cancers include these types: ? Breast. This occurs in males or females. ? Ovarian. ? Tubal. This may also be called fallopian tube cancer. ? Cancer of the abdominal or  pelvic lining (peritoneal cancer). ? Prostate. ? Pancreatic.  Cervical, Uterine, and Ovarian Cancer Your health care provider may recommend that you be screened regularly for cancer of the pelvic organs. These include your ovaries, uterus, and vagina. This screening involves a pelvic exam, which includes checking for microscopic changes to the surface of your cervix (Pap test).  For women ages 21-65, health care providers may recommend a pelvic exam and a Pap test every three years. For women ages 6-65, they may recommend the Pap test and pelvic exam, combined with testing for human papilloma virus (HPV), every five years. Some types of HPV increase your risk of cervical cancer. Testing for HPV may also be done on women of any age who have unclear Pap test results.  Other health care providers may not recommend any screening for nonpregnant women who are considered low risk for pelvic cancer and have no symptoms. Ask your health care provider if a screening pelvic exam is right for you.  If you have had past treatment for cervical cancer or a condition that could lead to cancer, you need Pap tests and screening for cancer for at least 20 years after your treatment. If  Pap tests have been discontinued for you, your risk factors (such as having a new sexual partner) need to be reassessed to determine if you should start having screenings again. Some women have medical problems that increase the chance of getting cervical cancer. In these cases, your health care provider may recommend that you have screening and Pap tests more often.  If you have a family history of uterine cancer or ovarian cancer, talk with your health care provider about genetic screening.  If you have vaginal bleeding after reaching menopause, tell your health care provider.  There are currently no reliable tests available to screen for ovarian cancer.  Lung Cancer Lung cancer screening is recommended for adults 19-80 years old  who are at high risk for lung cancer because of a history of smoking. A yearly low-dose CT scan of the lungs is recommended if you:  Currently smoke.  Have a history of at least 30 pack-years of smoking and you currently smoke or have quit within the past 15 years. A pack-year is smoking an average of one pack of cigarettes per day for one year.  Yearly screening should:  Continue until it has been 15 years since you quit.  Stop if you develop a health problem that would prevent you from having lung cancer treatment.  Colorectal Cancer  This type of cancer can be detected and can often be prevented.  Routine colorectal cancer screening usually begins at age 11 and continues through age 82.  If you have risk factors for colon cancer, your health care provider may recommend that you be screened at an earlier age.  If you have a family history of colorectal cancer, talk with your health care provider about genetic screening.  Your health care provider may also recommend using home test kits to check for hidden blood in your stool.  A small camera at the end of a tube can be used to examine your colon directly (sigmoidoscopy or colonoscopy). This is done to check for the earliest forms of colorectal cancer.  Direct examination of the colon should be repeated every 5-10 years until age 17. However, if early forms of precancerous polyps or small growths are found or if you have a family history or genetic risk for colorectal cancer, you may need to be screened more often.  Skin Cancer  Check your skin from head to toe regularly.  Monitor any moles. Be sure to tell your health care provider: ? About any new moles or changes in moles, especially if there is a change in a mole's shape or color. ? If you have a mole that is larger than the size of a pencil eraser.  If any of your family members has a history of skin cancer, especially at a young age, talk with your health care provider  about genetic screening.  Always use sunscreen. Apply sunscreen liberally and repeatedly throughout the day.  Whenever you are outside, protect yourself by wearing long sleeves, pants, a wide-brimmed hat, and sunglasses.  What should I know about osteoporosis? Osteoporosis is a condition in which bone destruction happens more quickly than new bone creation. After menopause, you may be at an increased risk for osteoporosis. To help prevent osteoporosis or the bone fractures that can happen because of osteoporosis, the following is recommended:  If you are 57-73 years old, get at least 1,000 mg of calcium and at least 600 mg of vitamin D per day.  If you are older than age 13 but  younger than age 19, get at least 1,200 mg of calcium and at least 600 mg of vitamin D per day.  If you are older than age 39, get at least 1,200 mg of calcium and at least 800 mg of vitamin D per day.  Smoking and excessive alcohol intake increase the risk of osteoporosis. Eat foods that are rich in calcium and vitamin D, and do weight-bearing exercises several times each week as directed by your health care provider. What should I know about how menopause affects my mental health? Depression may occur at any age, but it is more common as you become older. Common symptoms of depression include:  Low or sad mood.  Changes in sleep patterns.  Changes in appetite or eating patterns.  Feeling an overall lack of motivation or enjoyment of activities that you previously enjoyed.  Frequent crying spells.  Talk with your health care provider if you think that you are experiencing depression. What should I know about immunizations? It is important that you get and maintain your immunizations. These include:  Tetanus, diphtheria, and pertussis (Tdap) booster vaccine.  Influenza every year before the flu season begins.  Pneumonia vaccine.  Shingles vaccine.  Your health care provider may also recommend other  immunizations. This information is not intended to replace advice given to you by your health care provider. Make sure you discuss any questions you have with your health care provider. Document Released: 07/14/2005 Document Revised: 12/10/2015 Document Reviewed: 02/23/2015 Elsevier Interactive Patient Education  2018 [employer].

## 2017-11-26 NOTE — Progress Notes (Signed)
RN note reviewed and agree.  Janne Faulk S O'Sullivan NP 

## 2017-12-03 DIAGNOSIS — Z961 Presence of intraocular lens: Secondary | ICD-10-CM | POA: Diagnosis not present

## 2017-12-17 ENCOUNTER — Telehealth: Payer: Self-pay | Admitting: *Deleted

## 2017-12-17 MED ORDER — OMEPRAZOLE 40 MG PO CPDR: 40.0000 mg | DELAYED_RELEASE_CAPSULE | Freq: Every day | ORAL | 1 refills | Status: DC

## 2017-12-17 NOTE — Telephone Encounter (Signed)
Received request from [facility] for omeprazole. Refill sent.

## 2017-12-18 DIAGNOSIS — Z4789 Encounter for other orthopedic aftercare: Secondary | ICD-10-CM | POA: Diagnosis not present

## 2017-12-18 DIAGNOSIS — M1812 Unilateral primary osteoarthritis of first carpometacarpal joint, left hand: Secondary | ICD-10-CM | POA: Diagnosis not present

## 2017-12-19 ENCOUNTER — Encounter: Payer: Self-pay | Admitting: Family

## 2017-12-19 ENCOUNTER — Ambulatory Visit (INDEPENDENT_AMBULATORY_CARE_PROVIDER_SITE_OTHER): Payer: Medicare Other | Admitting: Family

## 2017-12-19 VITALS — HR 67 | Temp 97.8°F | Resp 18 | Ht 63.5 in | Wt 142.0 lb

## 2017-12-19 DIAGNOSIS — E785 Hyperlipidemia, unspecified: Secondary | ICD-10-CM

## 2017-12-19 DIAGNOSIS — I209 Angina pectoris, unspecified: Secondary | ICD-10-CM

## 2017-12-19 DIAGNOSIS — Z1159 Encounter for screening for other viral diseases: Secondary | ICD-10-CM

## 2017-12-19 DIAGNOSIS — F418 Other specified anxiety disorders: Secondary | ICD-10-CM | POA: Diagnosis not present

## 2017-12-19 LAB — LIPID PANEL
CHOLESTEROL: 164 mg/dL (ref 0–200)
HDL: 52.9 mg/dL (ref >39.00)
LDL Cholesterol: 84 mg/dL (ref 0–99)
NONHDL: 111.34
Total CHOL/HDL Ratio: 3
Triglycerides: 137 mg/dL (ref 0.0–149.0)
VLDL: 27.4 mg/dL (ref 0.0–40.0)

## 2017-12-19 NOTE — Progress Notes (Signed)
Subjective:    Patient ID: Samantha Landry, female    DOB: 08-Jun-1945, 72 y.o.   MRN: [id number]  HPI  Patient is a 72 yr old female who presents today for follow up.    Anxiety/depression-stable on lexapro.   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  Feels that she continues to feel like she needs lexapro given her stressors.  Hyperlipidemia- maintained on atorvastatin. Denies myalgia.  Lab Results  Component Value Date   CHOL 138 10/31/2016   HDL 47.40 10/31/2016   LDLCALC 57 10/31/2016   LDLDIRECT 142.9 04/04/2007   TRIG 169.0 (H) 10/31/2016   CHOLHDL 3 10/31/2016     Review of Systems See HPI  Past Medical History:  Diagnosis Date  . Allergy   . Alopecia 08/03/2011  . Anxiety   . Anxiety and depression 11/25/2015  . Arthritis   . Benign neoplasm of skin of trunk 11/14/2010  . Cancer ([place])   . Cataract   . Diverticulosis of colon   . DIVERTICULOSIS, COLON 03/26/2007   Qualifier: Diagnosis of  By: Wynona Luna   . GERD (gastroesophageal reflux disease)   . Heart murmur   . HOT FLASHES 03/25/2007   Qualifier: Diagnosis of  By: Wynona Luna   . Hyperlipidemia   . Melanoma in situ ([place]) 02/2007   right arm  . Osteopenia 08/20/2012  . PULMONARY NODULE 03/25/2007   Qualifier: Diagnosis of  By: Wynona Luna   . Pulmonary nodules 02/2007   2-65mm  . SKIN CANCER, HX OF 03/26/2007   Qualifier: Diagnosis of  By: Wynona Luna      Social History   Socioeconomic History  . Marital status: Married    Spouse name: Not on file  . Number of children: 2  . Years of education: Not on file  . Highest education level: Not on file  Occupational History  . Occupation: retired    Comment: Primary school teacher  Social Needs  . Financial resource strain: Not on file  . Food insecurity:    Worry: Not on file    Inability: Not on file  . Transportation needs:    Medical: Not on file    Non-medical: Not on file  Tobacco Use  . Smoking status: Never Smoker    . Smokeless tobacco: Never Used  Substance and Sexual Activity  . Alcohol use: Yes    Comment: 4-5 glasses of wine per week  . Drug use: No  . Sexual activity: Never  Lifestyle  . Physical activity:    Days per week: Not on file    Minutes per session: Not on file  . Stress: Not on file  Relationships  . Social connections:    Talks on phone: Not on file    Gets together: Not on file    Attends religious service: Not on file    Active member of club or organization: Not on file    Attends meetings of clubs or organizations: Not on file    Relationship status: Not on file  . Intimate partner violence:    Fear of current or ex partner: Not on file    Emotionally abused: Not on file    Physically abused: Not on file    Forced sexual activity: Not on file  Other Topics Concern  . 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 bone spur Left 11/07/2017   wrist  . BUNIONECTOMY Left   . COLONOSCOPY    . DILATION AND CURETTAGE OF UTERUS  2002   uterine fibroids  . EYE SURGERY Bilateral 2019  . LEFT HEART CATH AND CORONARY ANGIOGRAPHY N/A 06/25/2017   Procedure: LEFT HEART CATH AND CORONARY ANGIOGRAPHY;  Surgeon: Leonie Man, MD;  Location: [facility];  Service: Cardiovascular;  Laterality: N/A;  . TONSILLECTOMY      Family History  Problem Relation Age of Onset  . Lung cancer Father   . Colon cancer Cousin        1st pat.cousin  . Rectal cancer Neg Hx   . Stomach cancer Neg Hx   . Esophageal cancer Neg Hx   . Pancreatic cancer Neg Hx     Allergies  Allergen Reactions  . Codeine Nausea And Vomiting    REACTION: Jittery    Current Outpatient Medications on File Prior to Visit  Medication Sig Dispense Refill  . acetaminophen (TYLENOL) 325 MG tablet Take 650 mg by mouth every 6  (six) hours as needed for mild pain or moderate pain.    Marland Kitchen aspirin 81 MG tablet Take 81 mg by mouth daily.      Marland Kitchen atorvastatin (LIPITOR) 20 MG tablet Take 1 tablet (20 mg total) by mouth daily. 90 tablet 1  . Calcium-Vitamin D-Vitamin K [id number] MG-UNT-MCG TABS Take 1 tablet by mouth daily.    . diclofenac sodium (VOLTAREN) 1 % GEL Apply 2 g topically 4 (four) times daily as needed.     Marland Kitchen escitalopram (LEXAPRO) 10 MG tablet TAKE 1 TABLET BY MOUTH ONCE DAILY 90 tablet 1  . fish oil-omega-3 fatty acids 1000 MG capsule Take 1 g by mouth daily.     . Melatonin 1 MG TABS Take 1 mg by mouth at bedtime as needed (SLEEP).    Marland Kitchen omeprazole (PRILOSEC) 40 MG capsule Take 1 capsule (40 mg total) by mouth daily. 90 capsule 1  . ondansetron (ZOFRAN) 4 MG tablet Take 1 tablet (4 mg total) by mouth every 8 (eight) hours as needed for nausea or vomiting. 15 tablet 0  . psyllium (REGULOID) 0.52 g capsule Take 0.52 g by mouth at bedtime. [employer]    . nitroGLYCERIN (NITROSTAT) 0.4 MG SL tablet Place 1 tablet (0.4 mg total) under the tongue every 5 (five) minutes as needed for chest pain. (Patient not taking: Reported on 08/15/2017) 11 tablet 6   Current Facility-Administered Medications on File Prior to Visit  Medication Dose Route Frequency Provider Last Rate Last Dose  . 0.9 %  sodium chloride infusion  500 mL Intravenous Once Nelida Meuse III, MD        Pulse 67   Temp 97.8 F (36.6 C) (Oral)   Resp 18   Ht 5' 3.5" (1.613 m)   Wt 142 lb (64.4 kg)   SpO2 100%   BMI 24.76 kg/m       Objective:   Physical Exam  Constitutional: She appears well-developed and well-nourished.  Cardiovascular: Normal rate, regular rhythm and normal heart sounds.  No murmur heard. Pulmonary/Chest: Effort normal and breath sounds normal. No respiratory distress. She has no wheezes.  Psychiatric: She has a normal mood and affect. Her behavior is normal. Judgment and thought content normal.  Assessment & Plan:  Anxiety/depression- stable on lexapro. Continue same.  Hyperlipidemia- tolerating statin. Obtain follow up lipid panel.

## 2017-12-19 NOTE — Patient Instructions (Signed)
Please complete your lab work prior to leaving.

## 2017-12-20 ENCOUNTER — Encounter: Payer: Self-pay | Admitting: Family

## 2017-12-20 LAB — HEPATITIS C ANTIBODY
Hepatitis C Ab: NONREACTIVE
SIGNAL TO CUT-OFF: 0.01 (ref <1.00)

## 2017-12-21 NOTE — Progress Notes (Signed)
Letter mailed out to pt

## 2017-12-31 DIAGNOSIS — M79645 Pain in left finger(s): Secondary | ICD-10-CM | POA: Diagnosis not present

## 2018-01-02 ENCOUNTER — Ambulatory Visit: Payer: Medicare Other | Admitting: Family

## 2018-01-02 ENCOUNTER — Other Ambulatory Visit: Payer: Self-pay

## 2018-01-11 DIAGNOSIS — M79645 Pain in left finger(s): Secondary | ICD-10-CM | POA: Diagnosis not present

## 2018-01-15 DIAGNOSIS — Z85820 Personal history of malignant melanoma of skin: Secondary | ICD-10-CM | POA: Diagnosis not present

## 2018-01-15 DIAGNOSIS — D1801 Hemangioma of skin and subcutaneous tissue: Secondary | ICD-10-CM | POA: Diagnosis not present

## 2018-01-15 DIAGNOSIS — L814 Other melanin hyperpigmentation: Secondary | ICD-10-CM | POA: Diagnosis not present

## 2018-01-17 DIAGNOSIS — Z4789 Encounter for other orthopedic aftercare: Secondary | ICD-10-CM | POA: Diagnosis not present

## 2018-01-17 DIAGNOSIS — M1812 Unilateral primary osteoarthritis of first carpometacarpal joint, left hand: Secondary | ICD-10-CM | POA: Diagnosis not present

## 2018-01-17 DIAGNOSIS — M79645 Pain in left finger(s): Secondary | ICD-10-CM | POA: Diagnosis not present

## 2018-02-10 ENCOUNTER — Other Ambulatory Visit: Payer: Self-pay | Admitting: Family

## 2018-03-04 DIAGNOSIS — M1812 Unilateral primary osteoarthritis of first carpometacarpal joint, left hand: Secondary | ICD-10-CM | POA: Diagnosis not present

## 2018-03-04 DIAGNOSIS — M19041 Primary osteoarthritis, right hand: Secondary | ICD-10-CM | POA: Diagnosis not present

## 2018-03-07 DIAGNOSIS — Z23 Encounter for immunization: Secondary | ICD-10-CM | POA: Diagnosis not present

## 2018-05-22 ENCOUNTER — Other Ambulatory Visit: Payer: Self-pay | Admitting: Family

## 2018-05-23 IMAGING — DX DG CHEST 2V
2 series · 2 of 2 positions shown · non-contrast
Comparison: 10/19/2015

CLINICAL DATA: Abnormal stress test, pre cardiac catheterization,
chest pain and pressure

EXAM:
CHEST  2 VIEW

[chest pa]
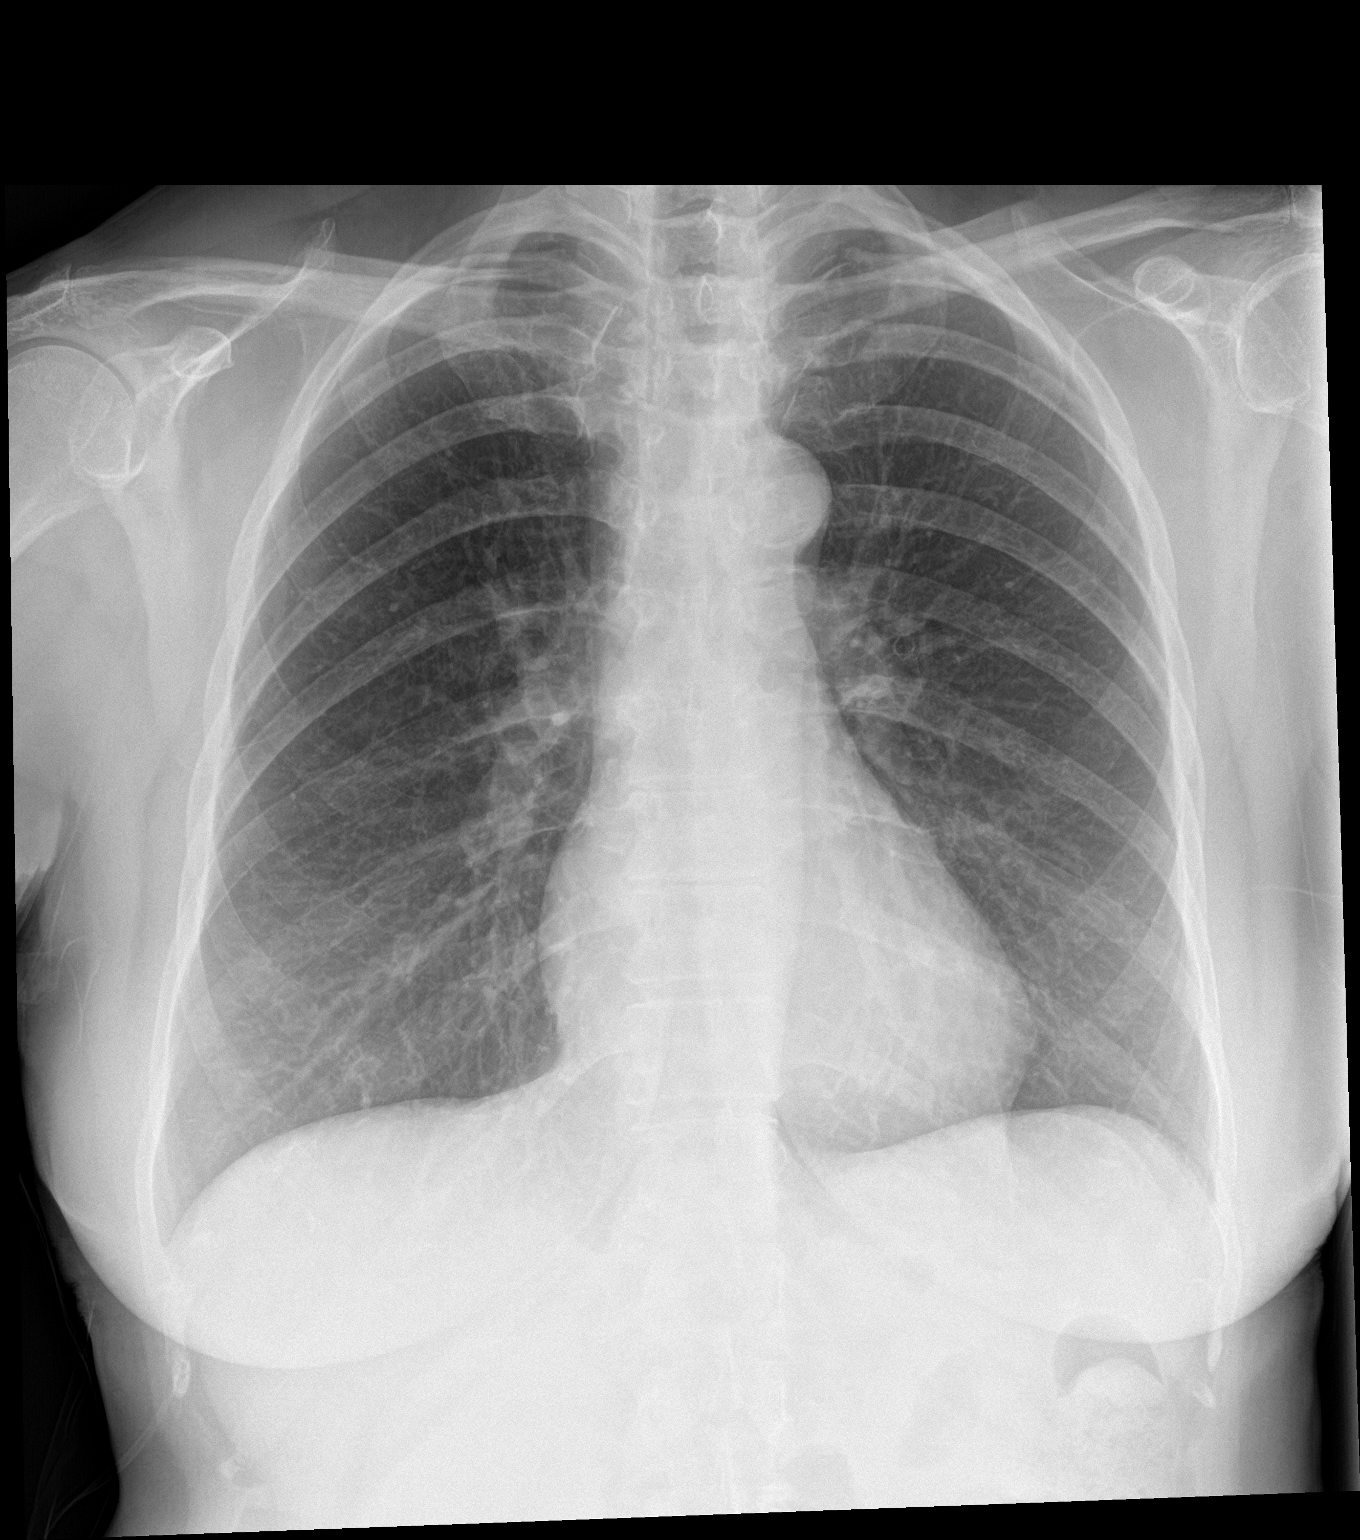

[chest lat]
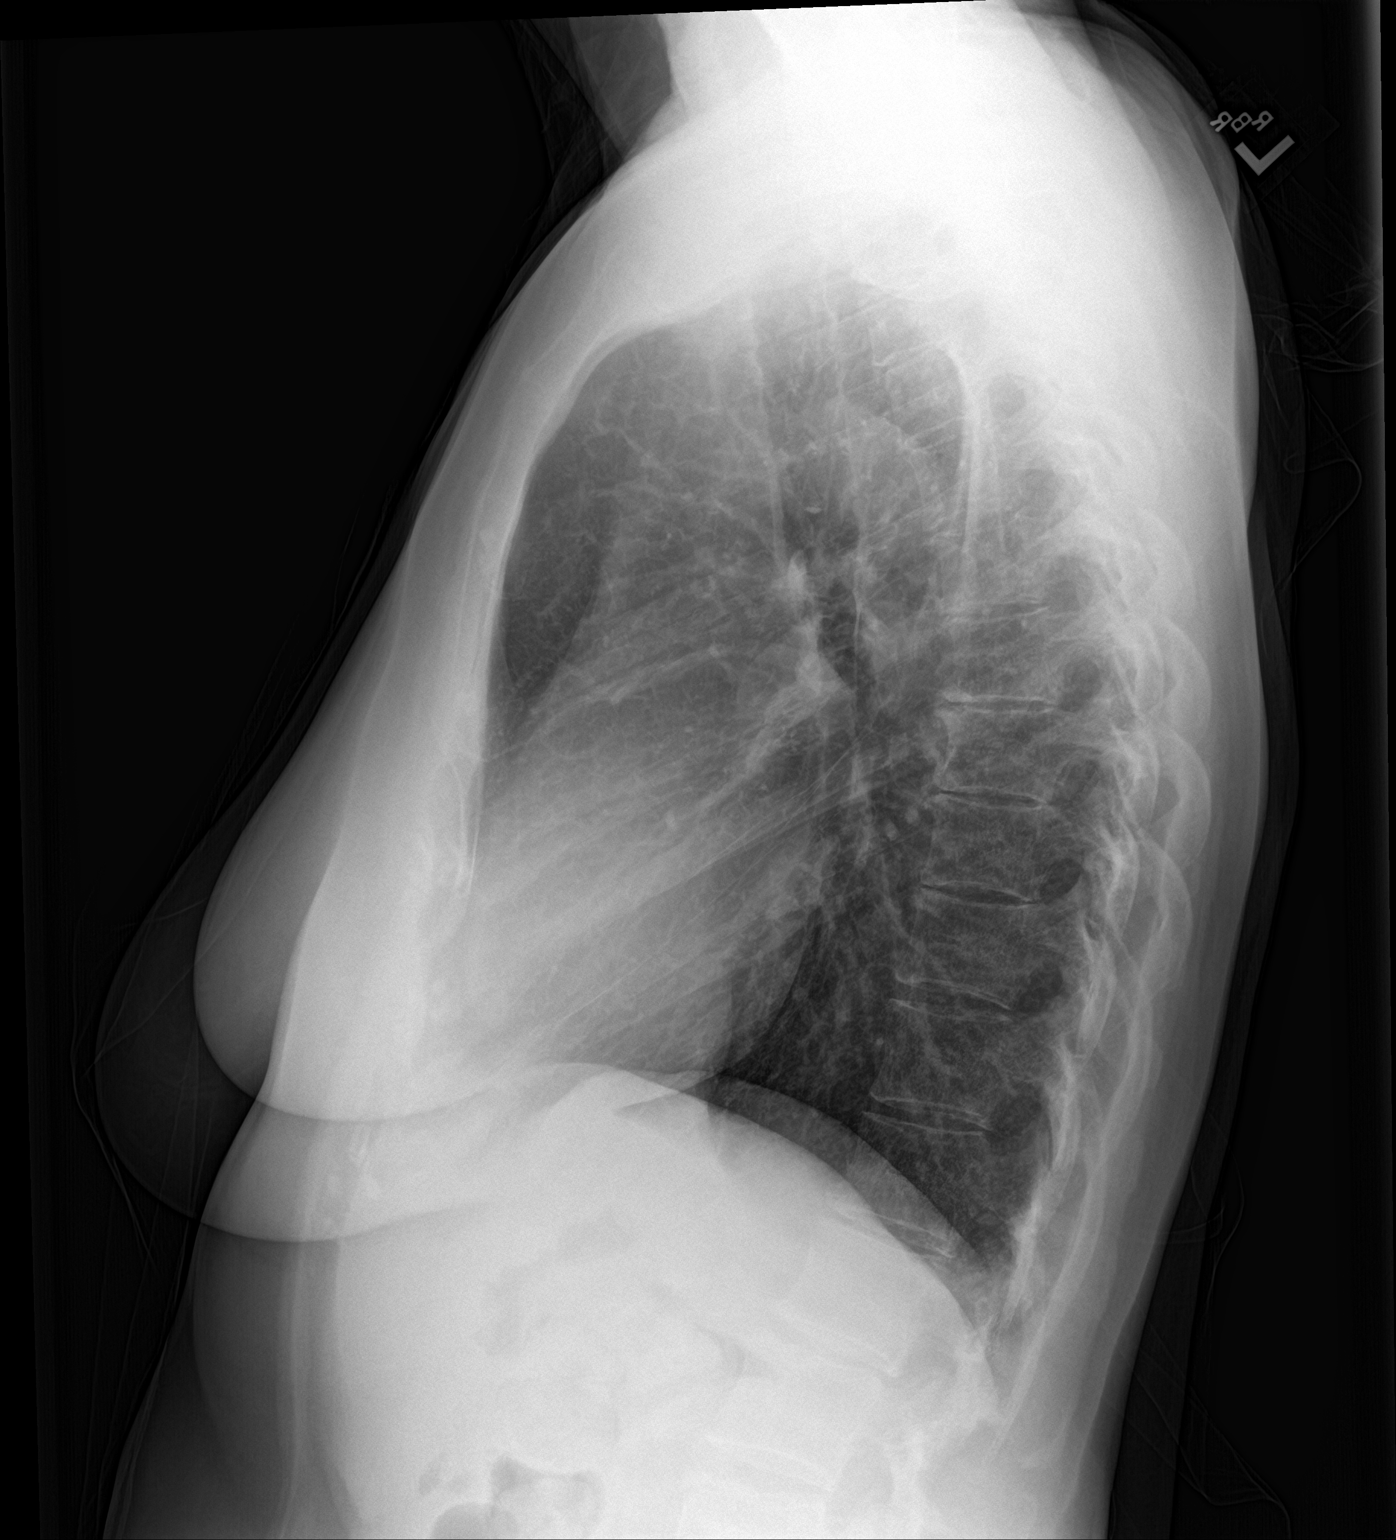

[2 of 2 positions shown; findings below may reference images not displayed]

FINDINGS: Normal heart size, mediastinal contours, and pulmonary vascularity.

Atherosclerotic calcification aorta.

Minimal chronic peribronchial thickening.

Lungs clear.

No pulmonary infiltrate, pleural effusion or pneumothorax.

Osseous structures unremarkable.
IMPRESSION: No acute abnormalities.

## 2018-06-24 ENCOUNTER — Ambulatory Visit (INDEPENDENT_AMBULATORY_CARE_PROVIDER_SITE_OTHER): Payer: Medicare Other | Admitting: Family

## 2018-06-24 ENCOUNTER — Encounter: Payer: Self-pay | Admitting: Family

## 2018-06-24 VITALS — BP 126/68 | HR 70 | Temp 97.8°F | Resp 16 | Ht 64.0 in | Wt 143.0 lb

## 2018-06-24 DIAGNOSIS — K219 Gastro-esophageal reflux disease without esophagitis: Secondary | ICD-10-CM | POA: Diagnosis not present

## 2018-06-24 DIAGNOSIS — F419 Anxiety disorder, unspecified: Secondary | ICD-10-CM | POA: Diagnosis not present

## 2018-06-24 DIAGNOSIS — F329 Major depressive disorder, single episode, unspecified: Secondary | ICD-10-CM

## 2018-06-24 DIAGNOSIS — E785 Hyperlipidemia, unspecified: Secondary | ICD-10-CM | POA: Diagnosis not present

## 2018-06-24 DIAGNOSIS — F32A Depression, unspecified: Secondary | ICD-10-CM

## 2018-06-24 MED ORDER — ESCITALOPRAM OXALATE 10 MG PO TABS: 10.0000 mg | ORAL_TABLET | Freq: Every day | ORAL | 1 refills | Status: DC

## 2018-06-24 MED ORDER — ATORVASTATIN CALCIUM 20 MG PO TABS: 20.0000 mg | ORAL_TABLET | Freq: Every day | ORAL | 1 refills | Status: DC

## 2018-06-24 MED ORDER — OMEPRAZOLE 40 MG PO CPDR: 40.0000 mg | DELAYED_RELEASE_CAPSULE | Freq: Every day | ORAL | 1 refills | Status: DC

## 2018-06-24 NOTE — Progress Notes (Signed)
Subjective:    Patient ID: Samantha Landry, female    DOB: 06-10-1945, 73 y.o.   MRN: [id number]  HPI  Patient is a 73 yr old female who presents today for follow up.   GERD- maintained on prilosec. She takes every day. Reports well controlled.   Hyperlipidemia- maintained on lipitor. Denies malgia.  Lab Results  Component Value Date   CHOL 164 12/19/2017   HDL 52.90 12/19/2017   LDLCALC 84 12/19/2017   LDLDIRECT 142.9 04/04/2007   TRIG 137.0 12/19/2017   CHOLHDL 3 12/19/2017   Anxiety/Depression- maintained on lexapro.  Continues to care for her husband who has dementia. He recently turned in his license. She feels that the lexapro helps with her patience in caring for her husband.  "I am better able to let things go."    She will be having orthopedic surgery on her right hand next week with Dr. Apolonio Schneiders.    Review of Systems    see HPI  Past Medical History:  Diagnosis Date  . Allergy   . Alopecia 08/03/2011  . Anxiety   . Anxiety and depression 11/25/2015  . Arthritis   . Benign neoplasm of skin of trunk 11/14/2010  . Cancer ([place])   . Cataract   . Diverticulosis of colon   . DIVERTICULOSIS, COLON 03/26/2007   Qualifier: Diagnosis of  By: Wynona Luna   . GERD (gastroesophageal reflux disease)   . Heart murmur   . HOT FLASHES 03/25/2007   Qualifier: Diagnosis of  By: Wynona Luna   . Hyperlipidemia   . Melanoma in situ ([place]) 02/2007   right arm  . Osteopenia 08/20/2012  . PULMONARY NODULE 03/25/2007   Qualifier: Diagnosis of  By: Wynona Luna   . Pulmonary nodules 02/2007   2-41mm  . SKIN CANCER, HX OF 03/26/2007   Qualifier: Diagnosis of  By: Wynona Luna      Social History   Socioeconomic History  . Marital status: Married    Spouse name: Not on file  . Number of children: 2  . Years of education: Not on file  . Highest education level: Not on file  Occupational History  . Occupation: retired    Comment: Primary school teacher  Social  Needs  . Financial resource strain: Not on file  . Food insecurity:    Worry: Not on file    Inability: Not on file  . Transportation needs:    Medical: Not on file    Non-medical: Not on file  Tobacco Use  . Smoking status: Never Smoker  . Smokeless tobacco: Never Used  Substance and Sexual Activity  . Alcohol use: Yes    Comment: 4-5 glasses of wine per week  . Drug use: No  . Sexual activity: Never  Lifestyle  . Physical activity:    Days per week: Not on file    Minutes per session: Not on file  . Stress: Not on file  Relationships  . Social connections:    Talks on phone: Not on file    Gets together: Not on file    Attends religious service: Not on file    Active member of club or organization: Not on file    Attends meetings of clubs or organizations: Not on file    Relationship status: Not on file  . Intimate partner violence:    Fear of current or ex partner: Not on file    Emotionally abused: Not  on file    Physically abused: Not on file    Forced sexual activity: Not on file  Other Topics Concern  . 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 bone spur Left 11/07/2017   thumb  . BUNIONECTOMY Left   . COLONOSCOPY    . DILATION AND CURETTAGE OF UTERUS  2002   uterine fibroids  . EYE SURGERY Bilateral 2019  . LEFT HEART CATH AND CORONARY ANGIOGRAPHY N/A 06/25/2017   Procedure: LEFT HEART CATH AND CORONARY ANGIOGRAPHY;  Surgeon: Leonie Man, MD;  Location: [facility];  Service: Cardiovascular;  Laterality: N/A;  . TONSILLECTOMY      Family History  Problem Relation Age of Onset  . Lung cancer Father   . Colon cancer Cousin        1st pat.cousin  . Rectal cancer Neg Hx   . Stomach cancer Neg Hx   . Esophageal cancer Neg Hx   . Pancreatic cancer Neg Hx      Allergies  Allergen Reactions  . Codeine Nausea And Vomiting    REACTION: Jittery    Current Outpatient Medications on File Prior to Visit  Medication Sig Dispense Refill  . acetaminophen (TYLENOL) 325 MG tablet Take 650 mg by mouth every 6 (six) hours as needed for mild pain or moderate pain.    Marland Kitchen aspirin 81 MG tablet Take 81 mg by mouth daily.      . Calcium-Vitamin D-Vitamin K 093-235-57 MG-UNT-MCG TABS Take 1 tablet by mouth daily.    . fish oil-omega-3 fatty acids 1000 MG capsule Take 1 g by mouth daily.     . psyllium (REGULOID) 0.52 g capsule Take 0.52 g by mouth at bedtime. [employer]    . nitroGLYCERIN (NITROSTAT) 0.4 MG SL tablet Place 1 tablet (0.4 mg total) under the tongue every 5 (five) minutes as needed for chest pain. (Patient not taking: Reported on 08/15/2017) 11 tablet 6   Current Facility-Administered Medications on File Prior to Visit  Medication Dose Route Frequency Provider Last Rate Last Dose  . 0.9 %  sodium chloride infusion  500 mL Intravenous Once Danis, Estill Cotta III, MD        BP 126/68 (BP Location: Left Arm, Patient Position: Sitting, Cuff Size: Small)   Pulse 70   Temp 97.8 F (36.6 C) (Oral)   Resp 16   Ht 5\' 4"  (1.626 m)   Wt 143 lb (64.9 kg)   SpO2 100%   BMI 24.55 kg/m    Objective:   Physical Exam Constitutional:      Appearance: She is well-developed.  Neck:     Musculoskeletal: Neck supple.     Thyroid: No thyromegaly.  Cardiovascular:     Rate and Rhythm: Normal rate and regular rhythm.     Heart sounds: Normal heart sounds. No murmur.  Pulmonary:     Effort: Pulmonary effort is normal. No respiratory distress.     Breath sounds: Normal breath sounds. No wheezing.  Skin:    General: Skin is warm and dry.  Neurological:     Mental Status: She is alert and oriented to person, place, and time.  Psychiatric:        Behavior: Behavior normal.  Thought Content: Thought content normal.        Judgment: Judgment  normal.           Assessment & Plan:  Hyperlipidemia- lipids at goal. Tolerating statin. Continue same.  Depression/anxiety- stable on lexapro, continue same.  GERD- stable on PPI, notes occasional hoarseness but otherwise ok. Continue ppi.

## 2018-06-26 DIAGNOSIS — M1812 Unilateral primary osteoarthritis of first carpometacarpal joint, left hand: Secondary | ICD-10-CM | POA: Diagnosis not present

## 2018-06-26 DIAGNOSIS — M1811 Unilateral primary osteoarthritis of first carpometacarpal joint, right hand: Secondary | ICD-10-CM | POA: Diagnosis not present

## 2018-06-26 DIAGNOSIS — G8918 Other acute postprocedural pain: Secondary | ICD-10-CM | POA: Diagnosis not present

## 2018-07-11 DIAGNOSIS — M13841 Other specified arthritis, right hand: Secondary | ICD-10-CM | POA: Diagnosis not present

## 2018-07-11 DIAGNOSIS — Z4789 Encounter for other orthopedic aftercare: Secondary | ICD-10-CM | POA: Diagnosis not present

## 2018-07-18 DIAGNOSIS — M13841 Other specified arthritis, right hand: Secondary | ICD-10-CM | POA: Diagnosis not present

## 2018-08-13 DIAGNOSIS — M13841 Other specified arthritis, right hand: Secondary | ICD-10-CM | POA: Diagnosis not present

## 2018-08-19 DIAGNOSIS — M25641 Stiffness of right hand, not elsewhere classified: Secondary | ICD-10-CM | POA: Diagnosis not present

## 2018-09-10 DIAGNOSIS — Z4789 Encounter for other orthopedic aftercare: Secondary | ICD-10-CM | POA: Diagnosis not present

## 2018-10-08 DIAGNOSIS — M13841 Other specified arthritis, right hand: Secondary | ICD-10-CM | POA: Diagnosis not present

## 2018-11-05 DIAGNOSIS — Z1231 Encounter for screening mammogram for malignant neoplasm of breast: Secondary | ICD-10-CM | POA: Diagnosis not present

## 2018-11-05 LAB — HM MAMMOGRAPHY

## 2018-11-26 DIAGNOSIS — M542 Cervicalgia: Secondary | ICD-10-CM | POA: Diagnosis not present

## 2018-11-26 DIAGNOSIS — M519 Unspecified thoracic, thoracolumbar and lumbosacral intervertebral disc disorder: Secondary | ICD-10-CM | POA: Diagnosis not present

## 2018-11-28 NOTE — Progress Notes (Signed)
Virtual Visit via Video Note  I connected with patient on 11/29/18 at  3:00 PM EDT by audio enabled telemedicine application and verified that I am speaking with the correct person using two identifiers.   THIS ENCOUNTER IS A VIRTUAL VISIT DUE TO COVID-19 - PATIENT WAS NOT SEEN IN THE OFFICE. PATIENT HAS CONSENTED TO VIRTUAL VISIT / TELEMEDICINE VISIT   Location of patient: home  Location of provider: office  I discussed the limitations of evaluation and management by telemedicine and the availability of in person appointments. The patient expressed understanding and agreed to proceed.   Subjective:   Samantha Landry is a 73 y.o. female who presents for Medicare Annual (Subsequent) preventive examination.  Review of Systems: No ROS.  Medicare Wellness Virtual Visit.  Visual/audio telehealth visit, UTA vital signs.   See social history for additional risk factors. Cardiac Risk Factors include: advanced age (>6men, >78 women);dyslipidemia Sleep patterns: no issues Home Safety/Smoke Alarms: Feels safe in home. Smoke alarms in place.  Lives with husband in 1 story home. Cares for husband who has dementia.    Female:        Mammo- pt reports done at [facility]- ordered      [facility]- 06/21/17. No longer doing routine screening due to age.      Objective:     Advanced Directives 11/29/2018 11/26/2017 06/25/2017 11/01/2016  Does Patient Have a Medical Advance Directive? Yes Yes Yes Yes  Type of Paramedic of [place];Living will [place];Living will - [place];Living will  Does patient want to make changes to medical advance directive? No - Patient declined - No - Patient declined -  Copy of Joanna in Chart? No - copy requested No - copy requested - No - copy requested    Tobacco Social History   Tobacco Use  Smoking Status Never Smoker  Smokeless Tobacco Never Used     Counseling given: Not  Answered   Clinical Intake: Pain : No/denies pain    Past Medical History:  Diagnosis Date  . Allergy   . Alopecia 08/03/2011  . Anxiety   . Anxiety and depression 11/25/2015  . Arthritis   . Benign neoplasm of skin of trunk 11/14/2010  . Cancer ([place])   . Cataract   . Diverticulosis of colon   . DIVERTICULOSIS, COLON 03/26/2007   Qualifier: Diagnosis of  By: Wynona Luna   . GERD (gastroesophageal reflux disease)   . Heart murmur   . HOT FLASHES 03/25/2007   Qualifier: Diagnosis of  By: Wynona Luna   . Hyperlipidemia   . Melanoma in situ ([place]) 02/2007   right arm  . Osteopenia 08/20/2012  . PULMONARY NODULE 03/25/2007   Qualifier: Diagnosis of  By: Wynona Luna   . Pulmonary nodules 02/2007   2-51mm  . SKIN CANCER, HX OF 03/26/2007   Qualifier: Diagnosis of  By: Wynona Luna    Past Surgical History:  Procedure Laterality Date  . bone spur Left 11/07/2017   thumb  . BUNIONECTOMY Left   . COLONOSCOPY    . DILATION AND CURETTAGE OF UTERUS  2002   uterine fibroids  . EYE SURGERY Bilateral 2019  . LEFT HEART CATH AND CORONARY ANGIOGRAPHY N/A 06/25/2017   Procedure: LEFT HEART CATH AND CORONARY ANGIOGRAPHY;  Surgeon: Leonie Man, MD;  Location: [facility];  Service: Cardiovascular;  Laterality: N/A;  .  TONSILLECTOMY     Family History  Problem Relation Age of Onset  . Lung cancer Father   . Colon cancer Cousin        1st pat.cousin  . Rectal cancer Neg Hx   . Stomach cancer Neg Hx   . Esophageal cancer Neg Hx   . Pancreatic cancer Neg Hx    Social History   Socioeconomic History  . Marital status: Married    Spouse name: Not on file  . Number of children: 2  . Years of education: Not on file  . Highest education level: Not on file  Occupational History  . Occupation: retired    Comment: Primary school teacher  Social Needs  . Financial resource strain: Not on file  . Food insecurity    Worry: Not on file    Inability: Not on  file  . Transportation needs    Medical: Not on file    Non-medical: Not on file  Tobacco Use  . Smoking status: Never Smoker  . Smokeless tobacco: Never Used  Substance and Sexual Activity  . Alcohol use: Yes    Comment: 4-5 glasses of wine per week  . Drug use: No  . Sexual activity: Never  Lifestyle  . Physical activity    Days per week: Not on file    Minutes per session: Not on file  . Stress: Not on file  Relationships  . Social Herbalist on phone: Not on file    Gets together: Not on file    Attends religious service: Not on file    Active member of club or organization: Not on file    Attends meetings of clubs or organizations: Not on file    Relationship status: Not on file  Other Topics Concern  . 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 acetaminophen (TYLENOL) 325 MG tablet Take 650 mg by mouth every 6 (six) hours as needed for mild pain or moderate pain.  Marland Kitchen aspirin 81 MG tablet Take 81 mg by mouth daily.    Marland Kitchen atorvastatin (LIPITOR) 20 MG tablet Take 1 tablet (20 mg total) by mouth daily.  . Calcium-Vitamin D-Vitamin K 545-625-63 MG-UNT-MCG TABS Take 1 tablet by mouth daily.  Marland Kitchen escitalopram (LEXAPRO) 10 MG tablet Take 1 tablet (10 mg total) by mouth daily.  . fish oil-omega-3 fatty acids 1000 MG capsule Take 1 g by mouth daily.   Marland Kitchen omeprazole (PRILOSEC) 40 MG capsule Take 1 capsule (40 mg total) by mouth daily.  . psyllium (REGULOID) 0.52 g capsule Take 0.52 g by mouth at bedtime. [employer]  . nitroGLYCERIN (NITROSTAT) 0.4 MG SL tablet Place 1 tablet (0.4 mg total) under the tongue every 5 (five) minutes as needed for chest pain. (Patient not taking: Reported on 08/15/2017)   Facility-Administered Encounter Medications as of 11/29/2018  Medication  .  0.9 %  sodium chloride infusion    Activities of Daily Living In your present state of health, do you have any difficulty performing the following activities: 11/29/2018  Hearing? N  Vision? N  Difficulty concentrating or making decisions? N  Walking or climbing stairs? N  Dressing or bathing? N  Doing errands, shopping? N  Preparing Food and eating ?  N  Using the Toilet? N  In the past six months, have you accidently leaked urine? N  Do you have problems with loss of bowel control? N  Managing your Medications? N  Managing your Finances? N  Housekeeping or managing your Housekeeping? N  Some recent data might be hidden    Patient Care Team: Debbrah Alar, NP as PCP - General (Internal Medicine) Delila Pereyra, MD as Consulting Physician (Gynecology)    Assessment:   This is a routine wellness examination for Samantha Landry. Physical assessment deferred to PCP.  Exercise Activities and Dietary recommendations Current Exercise Habits: Home exercise routine, Type of exercise: walking, Time (Minutes): 60, Frequency (Times/Week): 7, Weekly Exercise (Minutes/Week): 420, Intensity: Mild, Exercise limited by: None identified Diet (meal preparation, eat out, water intake, caffeinated beverages, dairy products, fruits and vegetables): in general, a "healthy" diet  , well balanced, on average, 3 meals per day       Goals    . Continue exercising (pt-stated)    . Maintain current healthy lifestyle.    . Weight (lb) < 135 lb (61.2 kg)     With exercise and healthy diet.       Fall Risk Fall Risk  01/02/2018 11/26/2017 11/01/2016 10/31/2016 10/19/2015  Falls in the past year? No No No No No  Comment Emmi Telephone Survey: data to providers prior to load - - - -    Depression Screen PHQ 2/9 Scores 06/24/2018 12/19/2017 11/26/2017 11/01/2016  PHQ - 2 Score 0 1 0 0  PHQ- 9 Score 2 2 - -     Cognitive Function Ad8 score reviewed for issues:  Issues making decisions:no  Less interest in  hobbies / activities:no  Repeats questions, stories (family complaining):no  Trouble using ordinary gadgets (microwave, computer, phone):no  Forgets the month or year: no  Mismanaging finances: no  Remembering appts:no  Daily problems with thinking and/or memory:no Ad8 score is=0         Immunization History  Administered Date(s) Administered  . Influenza Split 03/05/2012  . Influenza Whole 03/22/2010  . Influenza, High Dose Seasonal PF 02/23/2015, 02/25/2016, 02/14/2017  . Influenza-Unspecified 02/12/2017, 03/07/2018  . Pneumococcal Conjugate-13 09/22/2013  . Pneumococcal Polysaccharide-23 03/05/2012  . Tdap 10/26/2010  . Zoster 10/26/2010  . Zoster Recombinat (Shingrix) 05/02/2017    Screening Tests Health Maintenance  Topic Date Due  . INFLUENZA VACCINE  01/04/2019  . MAMMOGRAM  10/31/2019  . TETANUS/TDAP  10/25/2020  . DEXA SCAN  Completed  . Hepatitis C Screening  Completed  . PNA vac Low Risk Adult  Completed      Plan:   See you next year!  Continue to eat heart healthy diet (full of fruits, vegetables, whole grains, lean protein, water--limit salt, fat, and sugar intake) and increase physical activity as tolerated.  Continue doing brain stimulating activities (puzzles, reading, adult coloring books, staying active) to keep memory sharp.   Bring a copy of your living will and/or healthcare power of attorney to your next office visit.  I have ordered your bone density scan.   I have personally reviewed and noted the following in the patient's chart:   . Medical and social history . Use of alcohol, tobacco or illicit drugs  . Current medications and supplements . Functional ability and status . Nutritional status . Physical activity . Advanced directives . List of other physicians . Hospitalizations, surgeries, and ER visits in previous 12 months . Vitals . Screenings to include cognitive, depression, and falls .  Referrals and appointments  In  addition, I have reviewed and discussed with patient certain preventive protocols, quality metrics, and best practice recommendations. A written personalized care plan for preventive services as well as general preventive health recommendations were provided to patient.     Naaman Plummer [place], [place]  11/29/2018

## 2018-11-29 ENCOUNTER — Ambulatory Visit (INDEPENDENT_AMBULATORY_CARE_PROVIDER_SITE_OTHER): Payer: Medicare Other | Admitting: *Deleted

## 2018-11-29 ENCOUNTER — Other Ambulatory Visit: Payer: Self-pay

## 2018-11-29 ENCOUNTER — Encounter: Payer: Self-pay | Admitting: *Deleted

## 2018-11-29 DIAGNOSIS — Z780 Asymptomatic menopausal state: Secondary | ICD-10-CM

## 2018-11-29 DIAGNOSIS — Z0000 Encounter for general adult medical examination without abnormal findings: Secondary | ICD-10-CM

## 2018-11-29 NOTE — Progress Notes (Signed)
RN note reviewed and agree.  Braya Habermehl S O'Sullivan NP 

## 2018-11-29 NOTE — Patient Instructions (Signed)
See you next year!  Continue to eat heart healthy diet (full of fruits, vegetables, whole grains, lean protein, water--limit salt, fat, and sugar intake) and increase physical activity as tolerated.  Continue doing brain stimulating activities (puzzles, reading, adult coloring books, staying active) to keep memory sharp.   Bring a copy of your living will and/or healthcare power of attorney to your next office visit.  I have ordered your bone density scan.    Samantha Landry , Thank you for taking time to come for your Medicare Wellness Visit. I appreciate your ongoing commitment to your health goals. Please review the following plan we discussed and let me know if I can assist you in the future.   These are the goals we discussed: Goals    . Continue exercising (pt-stated)    . Maintain current healthy lifestyle.    . Weight (lb) < 135 lb (61.2 kg)     With exercise and healthy diet.       This is a list of the screening recommended for you and due dates:  Health Maintenance  Topic Date Due  . Flu Shot  01/04/2019  . Mammogram  10/31/2019  . Tetanus Vaccine  10/25/2020  . DEXA scan (bone density measurement)  Completed  .  Hepatitis C: One time screening is recommended by Center for Disease Control  (CDC) for  adults born from [birth year] through 1964.   Completed  . Pneumonia vaccines  Completed

## 2018-12-04 DIAGNOSIS — M542 Cervicalgia: Secondary | ICD-10-CM | POA: Diagnosis not present

## 2018-12-05 DIAGNOSIS — Z9889 Other specified postprocedural states: Secondary | ICD-10-CM | POA: Diagnosis not present

## 2018-12-05 DIAGNOSIS — Z961 Presence of intraocular lens: Secondary | ICD-10-CM | POA: Diagnosis not present

## 2018-12-11 DIAGNOSIS — M542 Cervicalgia: Secondary | ICD-10-CM | POA: Diagnosis not present

## 2018-12-20 DIAGNOSIS — M542 Cervicalgia: Secondary | ICD-10-CM | POA: Diagnosis not present

## 2018-12-23 ENCOUNTER — Encounter: Payer: Self-pay | Admitting: Family

## 2018-12-23 ENCOUNTER — Ambulatory Visit (INDEPENDENT_AMBULATORY_CARE_PROVIDER_SITE_OTHER): Payer: Medicare Other | Admitting: Family

## 2018-12-23 VITALS — BP 122/70 | HR 69 | Wt 140.0 lb

## 2018-12-23 DIAGNOSIS — F419 Anxiety disorder, unspecified: Secondary | ICD-10-CM

## 2018-12-23 DIAGNOSIS — F329 Major depressive disorder, single episode, unspecified: Secondary | ICD-10-CM

## 2018-12-23 DIAGNOSIS — E348 Other specified endocrine disorders: Secondary | ICD-10-CM | POA: Diagnosis not present

## 2018-12-23 DIAGNOSIS — K219 Gastro-esophageal reflux disease without esophagitis: Secondary | ICD-10-CM

## 2018-12-23 DIAGNOSIS — Z13820 Encounter for screening for osteoporosis: Secondary | ICD-10-CM | POA: Diagnosis not present

## 2018-12-23 DIAGNOSIS — E785 Hyperlipidemia, unspecified: Secondary | ICD-10-CM

## 2018-12-23 DIAGNOSIS — M8580 Other specified disorders of bone density and structure, unspecified site: Secondary | ICD-10-CM | POA: Diagnosis not present

## 2018-12-23 DIAGNOSIS — F32A Depression, unspecified: Secondary | ICD-10-CM

## 2018-12-23 MED ORDER — ATORVASTATIN CALCIUM 20 MG PO TABS: 20.0000 mg | ORAL_TABLET | Freq: Every day | ORAL | 1 refills | Status: DC

## 2018-12-23 MED ORDER — OMEPRAZOLE 40 MG PO CPDR: 40.0000 mg | DELAYED_RELEASE_CAPSULE | Freq: Every day | ORAL | 1 refills | Status: DC

## 2018-12-23 MED ORDER — ESCITALOPRAM OXALATE 10 MG PO TABS: 10.0000 mg | ORAL_TABLET | Freq: Every day | ORAL | 1 refills | Status: DC

## 2018-12-23 NOTE — Progress Notes (Signed)
Virtual Visit via Video Note  I connected with Samantha Landry on 12/23/18 at  9:00 AM EDT by a video enabled telemedicine application and verified that I am speaking with the correct person using two identifiers.  Location: Patient: home Provider: home   I discussed the limitations of evaluation and management by telemedicine and the availability of in person appointments. The patient expressed understanding and agreed to proceed.  History of Present Illness:  Patient is a 73 yr old female who presents today for routine follow up.  Depression/anxiety-maintained on lexapro.   GERD-maintained on prilosec. Reports symptoms are stable.    Hyperlipidemia- maintained on lipitor. Denies myalgia.  Lab Results  Component Value Date   CHOL 164 12/19/2017   HDL 52.90 12/19/2017   LDLCALC 84 12/19/2017   LDLDIRECT 142.9 04/04/2007   TRIG 137.0 12/19/2017   CHOLHDL 3 12/19/2017    Past Medical History:  Diagnosis Date  . Allergy   . Alopecia 08/03/2011  . Anxiety   . Anxiety and depression 11/25/2015  . Arthritis   . Benign neoplasm of skin of trunk 11/14/2010  . Cancer ([place])   . Cataract   . Diverticulosis of colon   . DIVERTICULOSIS, COLON 03/26/2007   Qualifier: Diagnosis of  By: Wynona Luna   . GERD (gastroesophageal reflux disease)   . Heart murmur   . HOT FLASHES 03/25/2007   Qualifier: Diagnosis of  By: Wynona Luna   . Hyperlipidemia   . Melanoma in situ ([place]) 02/2007   right arm  . Osteopenia 08/20/2012  . PULMONARY NODULE 03/25/2007   Qualifier: Diagnosis of  By: Wynona Luna   . Pulmonary nodules 02/2007   2-65mm  . SKIN CANCER, HX OF 03/26/2007   Qualifier: Diagnosis of  By: Wynona Luna      Social History   Socioeconomic History  . Marital status: Married    Spouse name: Not on file  . Number of children: 2  . Years of education: Not on file  . Highest education level: Not on file  Occupational History  . Occupation: retired    Comment:  Primary school teacher  Social Needs  . Financial resource strain: Not on file  . Food insecurity    Worry: Not on file    Inability: Not on file  . Transportation needs    Medical: Not on file    Non-medical: Not on file  Tobacco Use  . Smoking status: Never Smoker  . Smokeless tobacco: Never Used  Substance and Sexual Activity  . Alcohol use: Yes    Comment: 4-5 glasses of wine per week  . Drug use: No  . Sexual activity: Never  Lifestyle  . Physical activity    Days per week: Not on file    Minutes per session: Not on file  . Stress: Not on file  Relationships  . Social Herbalist on phone: Not on file    Gets together: Not on file    Attends religious service: Not on file    Active member of club or organization: Not on file    Attends meetings of clubs or organizations: Not on file    Relationship status: Not on file  . Intimate partner violence    Fear of current or ex partner: Not on file    Emotionally abused: Not on file    Physically abused: Not on file    Forced sexual activity: Not on  file  Other Topics Concern  . 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 bone spur Left 11/07/2017   thumb  . BUNIONECTOMY Left   . COLONOSCOPY    . DILATION AND CURETTAGE OF UTERUS  2002   uterine fibroids  . EYE SURGERY Bilateral 2019  . LEFT HEART CATH AND CORONARY ANGIOGRAPHY N/A 06/25/2017   Procedure: LEFT HEART CATH AND CORONARY ANGIOGRAPHY;  Surgeon: Leonie Man, MD;  Location: [facility];  Service: Cardiovascular;  Laterality: N/A;  . TONSILLECTOMY      Family History  Problem Relation Age of Onset  . Lung cancer Father   . Colon cancer Cousin        1st pat.cousin  . Rectal cancer Neg Hx   . Stomach cancer Neg Hx   . Esophageal cancer Neg Hx   . Pancreatic  cancer Neg Hx     Allergies  Allergen Reactions  . Codeine Nausea And Vomiting    REACTION: Jittery    Current Outpatient Medications on File Prior to Visit  Medication Sig Dispense Refill  . acetaminophen (TYLENOL) 325 MG tablet Take 650 mg by mouth every 6 (six) hours as needed for mild pain or moderate pain.    Marland Kitchen aspirin 81 MG tablet Take 81 mg by mouth daily.      Marland Kitchen atorvastatin (LIPITOR) 20 MG tablet Take 1 tablet (20 mg total) by mouth daily. 90 tablet 1  . Calcium-Vitamin D-Vitamin K 235-573-22 MG-UNT-MCG TABS Take 1 tablet by mouth daily.    Marland Kitchen escitalopram (LEXAPRO) 10 MG tablet Take 1 tablet (10 mg total) by mouth daily. 90 tablet 1  . fish oil-omega-3 fatty acids 1000 MG capsule Take 1 g by mouth daily.     Marland Kitchen omeprazole (PRILOSEC) 40 MG capsule Take 1 capsule (40 mg total) by mouth daily. 90 capsule 1  . nitroGLYCERIN (NITROSTAT) 0.4 MG SL tablet Place 1 tablet (0.4 mg total) under the tongue every 5 (five) minutes as needed for chest pain. (Patient not taking: Reported on 08/15/2017) 11 tablet 6   Current Facility-Administered Medications on File Prior to Visit  Medication Dose Route Frequency Provider Last Rate Last Dose  . 0.9 %  sodium chloride infusion  500 mL Intravenous Once Danis, Estill Cotta III, MD        BP 122/70   Pulse 69   Wt 140 lb (63.5 kg)   BMI 24.03 kg/m        Observations/Objective:   Gen: Awake, alert, no acute distress Resp: Breathing is even and non-labored Psych: calm/pleasant demeanor Neuro: Alert and Oriented x 3, + facial symmetry, speech is clear.   Assessment and Plan:  GERD- stable on PPI. Continue same.  Anxiety/depression- stable on lexapro.  Hyperlipidemia- tolerating statin.  Will defer follow up lipid panel due to covid-19.   Follow Up Instructions:    I discussed the assessment and treatment plan with the patient. The patient was provided an opportunity to ask questions and all were answered. The patient agreed with the  plan and demonstrated an understanding of the instructions.   The patient was advised to call back or seek an in-person evaluation if the symptoms worsen or if the condition fails to improve as anticipated.  [name], NP

## 2018-12-23 NOTE — Addendum Note (Signed)
Addended by: Naaman Plummer A on: 12/23/2018 09:29 AM   Modules accepted: Orders

## 2018-12-26 ENCOUNTER — Encounter: Payer: Self-pay | Admitting: Family

## 2018-12-26 DIAGNOSIS — Z8262 Family history of osteoporosis: Secondary | ICD-10-CM | POA: Diagnosis not present

## 2018-12-26 DIAGNOSIS — M85851 Other specified disorders of bone density and structure, right thigh: Secondary | ICD-10-CM | POA: Diagnosis not present

## 2019-01-13 ENCOUNTER — Telehealth: Payer: Self-pay | Admitting: Family

## 2019-01-16 NOTE — Telephone Encounter (Signed)
See my chart message

## 2019-01-17 DIAGNOSIS — M1812 Unilateral primary osteoarthritis of first carpometacarpal joint, left hand: Secondary | ICD-10-CM | POA: Diagnosis not present

## 2019-01-17 DIAGNOSIS — M13841 Other specified arthritis, right hand: Secondary | ICD-10-CM | POA: Diagnosis not present

## 2019-03-04 DIAGNOSIS — L814 Other melanin hyperpigmentation: Secondary | ICD-10-CM | POA: Diagnosis not present

## 2019-03-04 DIAGNOSIS — Z85820 Personal history of malignant melanoma of skin: Secondary | ICD-10-CM | POA: Diagnosis not present

## 2019-03-04 DIAGNOSIS — D229 Melanocytic nevi, unspecified: Secondary | ICD-10-CM | POA: Diagnosis not present

## 2019-03-04 DIAGNOSIS — D485 Neoplasm of uncertain behavior of skin: Secondary | ICD-10-CM | POA: Diagnosis not present

## 2019-03-04 DIAGNOSIS — D2261 Melanocytic nevi of right upper limb, including shoulder: Secondary | ICD-10-CM | POA: Diagnosis not present

## 2019-03-06 DIAGNOSIS — Z23 Encounter for immunization: Secondary | ICD-10-CM | POA: Diagnosis not present

## 2019-04-15 DIAGNOSIS — M1812 Unilateral primary osteoarthritis of first carpometacarpal joint, left hand: Secondary | ICD-10-CM | POA: Diagnosis not present

## 2019-04-15 DIAGNOSIS — M13841 Other specified arthritis, right hand: Secondary | ICD-10-CM | POA: Diagnosis not present

## 2019-06-27 ENCOUNTER — Encounter: Payer: Self-pay | Admitting: Family

## 2019-06-27 ENCOUNTER — Other Ambulatory Visit: Payer: Self-pay

## 2019-06-27 ENCOUNTER — Ambulatory Visit (INDEPENDENT_AMBULATORY_CARE_PROVIDER_SITE_OTHER): Payer: Medicare Other | Admitting: Family

## 2019-06-27 VITALS — BP 118/60 | HR 77 | Ht 63.0 in | Wt 143.0 lb

## 2019-06-27 DIAGNOSIS — F329 Major depressive disorder, single episode, unspecified: Secondary | ICD-10-CM

## 2019-06-27 DIAGNOSIS — E785 Hyperlipidemia, unspecified: Secondary | ICD-10-CM

## 2019-06-27 DIAGNOSIS — F419 Anxiety disorder, unspecified: Secondary | ICD-10-CM | POA: Diagnosis not present

## 2019-06-27 DIAGNOSIS — K219 Gastro-esophageal reflux disease without esophagitis: Secondary | ICD-10-CM

## 2019-06-27 MED ORDER — ATORVASTATIN CALCIUM 20 MG PO TABS: 20.0000 mg | ORAL_TABLET | Freq: Every day | ORAL | 1 refills | Status: DC

## 2019-06-27 MED ORDER — OMEPRAZOLE 40 MG PO CPDR: 40.0000 mg | DELAYED_RELEASE_CAPSULE | Freq: Every day | ORAL | 1 refills | Status: DC

## 2019-06-27 MED ORDER — ESCITALOPRAM OXALATE 10 MG PO TABS: 10.0000 mg | ORAL_TABLET | Freq: Every day | ORAL | 1 refills | Status: DC

## 2019-06-27 NOTE — Progress Notes (Signed)
Virtual Visit via Video Note  I connected with Samantha Landry on 06/27/19 at  9:20 AM EST by a video enabled telemedicine application and verified that I am speaking with the correct person using two identifiers.  Location: Patient: home Provider: work   I discussed the limitations of evaluation and management by telemedicine and the availability of in person appointments. The patient expressed understanding and agreed to proceed.  History of Present Illness:  Pt present   Hyperlipidemia- Continues atorvastatin. Denies myalgia.  Lab Results  Component Value Date   CHOL 164 12/19/2017   HDL 52.90 12/19/2017   LDLCALC 84 12/19/2017   LDLDIRECT 142.9 04/04/2007   TRIG 137.0 12/19/2017   CHOLHDL 3 12/19/2017   Anxiety/Depression- reports that she is handling stress well.   GERD- continues omeprazole. Reports well controlled.  Past Medical History:  Diagnosis Date  . Allergy   . Alopecia 08/03/2011  . Anxiety   . Anxiety and depression 11/25/2015  . Arthritis   . Benign neoplasm of skin of trunk 11/14/2010  . Cancer ([place])   . Cataract   . Diverticulosis of colon   . DIVERTICULOSIS, COLON 03/26/2007   Qualifier: Diagnosis of  By: Wynona Luna   . GERD (gastroesophageal reflux disease)   . Heart murmur   . HOT FLASHES 03/25/2007   Qualifier: Diagnosis of  By: Wynona Luna   . Hyperlipidemia   . Melanoma in situ ([place]) 02/2007   right arm  . Osteopenia 08/20/2012  . PULMONARY NODULE 03/25/2007   Qualifier: Diagnosis of  By: Wynona Luna   . Pulmonary nodules 02/2007   2-30mm  . SKIN CANCER, HX OF 03/26/2007   Qualifier: Diagnosis of  By: Wynona Luna      Social History   Socioeconomic History  . Marital status: Married    Spouse name: Not on file  . Number of children: 2  . Years of education: Not on file  . Highest education level: Not on file  Occupational History  . Occupation: retired    Comment: Primary school teacher  Tobacco Use  . Smoking  status: Never Smoker  . Smokeless tobacco: Never Used  Substance and Sexual Activity  . Alcohol use: Yes    Comment: 4-5 glasses of wine per week  . Drug use: No  . Sexual activity: Never  Other Topics Concern  . 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 Difficulty of Paying Living Expenses: Not on file  Food Insecurity:   . Worried About Charity fundraiser in the Last Year: Not on file  . Ran Out of Food in the Last Year: Not on file  Transportation Needs:   . Lack of Transportation (Medical): Not on file  . Lack of Transportation (Non-Medical): Not on file  Physical Activity:   . Days of Exercise per Week: Not on file  . Minutes of Exercise per Session: Not on file  Stress:   . Feeling of Stress : Not on file  Social Connections:   . Frequency of Communication with Friends and Family: Not on file  . Frequency of Social Gatherings with Friends and Family: Not on file  . Attends Religious Services: Not on file  .  Active Member of Clubs or Organizations: Not on file  . Attends Archivist Meetings: Not on file  . Marital Status: Not on file  Intimate Partner Violence:   . Fear of Current or Ex-Partner: Not on file  . Emotionally Abused: Not on file  . Physically Abused: Not on file  . Sexually Abused: Not on file    Past Surgical History:  Procedure Laterality Date  . bone spur Left 11/07/2017   thumb  . BUNIONECTOMY Left   . COLONOSCOPY    . DILATION AND CURETTAGE OF UTERUS  2002   uterine fibroids  . EYE SURGERY Bilateral 2019  . LEFT HEART CATH AND CORONARY ANGIOGRAPHY N/A 06/25/2017   Procedure: LEFT HEART CATH AND CORONARY ANGIOGRAPHY;  Surgeon: Leonie Man, MD;  Location: [facility];  Service: Cardiovascular;  Laterality: N/A;  . TONSILLECTOMY       Family History  Problem Relation Age of Onset  . Lung cancer Father   . Colon cancer Cousin        1st pat.cousin  . Rectal cancer Neg Hx   . Stomach cancer Neg Hx   . Esophageal cancer Neg Hx   . Pancreatic cancer Neg Hx     Allergies  Allergen Reactions  . Codeine Nausea And Vomiting    REACTION: Jittery    Current Outpatient Medications on File Prior to Visit  Medication Sig Dispense Refill  . acetaminophen (TYLENOL) 325 MG tablet Take 650 mg by mouth every 6 (six) hours as needed for mild pain or moderate pain.    Marland Kitchen aspirin 81 MG tablet Take 81 mg by mouth daily.      Marland Kitchen atorvastatin (LIPITOR) 20 MG tablet Take 1 tablet (20 mg total) by mouth daily. 90 tablet 1  . Calcium-Vitamin D-Vitamin K [id number] MG-UNT-MCG TABS Take 1 tablet by mouth daily.    Marland Kitchen escitalopram (LEXAPRO) 10 MG tablet Take 1 tablet (10 mg total) by mouth daily. 90 tablet 1  . fish oil-omega-3 fatty acids 1000 MG capsule Take 1 g by mouth daily.     Marland Kitchen omeprazole (PRILOSEC) 40 MG capsule Take 1 capsule (40 mg total) by mouth daily. 90 capsule 1   Current Facility-Administered Medications on File Prior to Visit  Medication Dose Route Frequency Provider Last Rate Last Admin  . 0.9 %  sodium chloride infusion  500 mL Intravenous Once Danis, Estill Cotta III, MD        BP 118/60 (BP Location: Left Arm, Patient Position: Sitting, Cuff Size: Small)   Pulse 77   Ht 5\' 3"  (1.6 m)   Wt 143 lb (64.9 kg)   BMI 25.33 kg/m    Observations/Objective:   Gen: Awake, alert, no acute distress Resp: Breathing is even and non-labored Psych: calm/pleasant demeanor Neuro: Alert and Oriented x 3, + facial symmetry, speech is clear.   Assessment and Plan:  Anxiety/depression- stable on lexapro. Continue same.  Hyperlipidemia- tolerating statin. She will return for follow up lipid panel.   GERD- stable on PPI, continue same.  Follow Up Instructions:    I discussed the assessment and treatment plan with the  patient. The patient was provided an opportunity to ask questions and all were answered. The patient agreed with the plan and demonstrated an understanding of the instructions.   The patient was advised to call back or seek an in-person evaluation if the symptoms worsen or if the condition fails to improve as anticipated.  [name], NP

## 2019-06-27 NOTE — Patient Instructions (Signed)
Below are two ways to schedule a Covid-19 Vaccine:  Please visit [email or web address] to register or call [phone]  Or call:  [place] Covid-19 vaccine scheduling at [phone]  

## 2019-07-09 ENCOUNTER — Other Ambulatory Visit (INDEPENDENT_AMBULATORY_CARE_PROVIDER_SITE_OTHER): Payer: Medicare Other

## 2019-07-09 ENCOUNTER — Other Ambulatory Visit: Payer: Self-pay

## 2019-07-09 DIAGNOSIS — E785 Hyperlipidemia, unspecified: Secondary | ICD-10-CM | POA: Diagnosis not present

## 2019-07-09 LAB — COMPREHENSIVE METABOLIC PANEL
ALT: 16 U/L (ref 0–35)
AST: 21 U/L (ref 0–37)
Albumin: 4.3 g/dL (ref 3.5–5.2)
Alkaline Phosphatase: 55 U/L (ref 39–117)
BUN: 20 mg/dL (ref 6–23)
CO2: 30 mEq/L (ref 19–32)
Calcium: 9.6 mg/dL (ref 8.4–10.5)
Chloride: 103 mEq/L (ref 96–112)
Creatinine, Ser: 0.79 mg/dL (ref 0.40–1.20)
GFR: 71.42 mL/min (ref >60.00)
Glucose, Bld: 93 mg/dL (ref 70–99)
Potassium: 4.1 mEq/L (ref 3.5–5.1)
Sodium: 139 mEq/L (ref 135–145)
Total Bilirubin: 0.5 mg/dL (ref 0.2–1.2)
Total Protein: 6.8 g/dL (ref 6.0–8.3)

## 2019-07-09 LAB — LIPID PANEL
Cholesterol: 156 mg/dL (ref 0–200)
HDL: 46.7 mg/dL (ref >39.00)
LDL Cholesterol: 86 mg/dL (ref 0–99)
NonHDL: 109.37
Total CHOL/HDL Ratio: 3
Triglycerides: 116 mg/dL (ref 0.0–149.0)
VLDL: 23.2 mg/dL (ref 0.0–40.0)

## 2019-07-17 DIAGNOSIS — Z23 Encounter for immunization: Secondary | ICD-10-CM | POA: Diagnosis not present

## 2019-08-15 DIAGNOSIS — Z23 Encounter for immunization: Secondary | ICD-10-CM | POA: Diagnosis not present

## 2019-10-14 DIAGNOSIS — M13841 Other specified arthritis, right hand: Secondary | ICD-10-CM | POA: Diagnosis not present

## 2019-11-06 DIAGNOSIS — Z1231 Encounter for screening mammogram for malignant neoplasm of breast: Secondary | ICD-10-CM | POA: Diagnosis not present

## 2019-11-06 LAB — HM MAMMOGRAPHY

## 2019-12-02 ENCOUNTER — Ambulatory Visit: Payer: Medicare Other | Admitting: *Deleted

## 2019-12-02 NOTE — Progress Notes (Signed)
I connected with Samantha Landry today by telephone and verified that I am speaking with the correct person using two identifiers. Location patient: home Location provider: work Persons participating in the virtual visit: patient, Marine scientist.    I discussed the limitations, risks, security and privacy concerns of performing an evaluation and management service by telephone and the availability of in person appointments. I also discussed with the patient that there may be a patient responsible charge related to this service. The patient expressed understanding and verbally consented to this telephonic visit.    Interactive audio and video telecommunications were attempted between this provider and patient, however failed, due to patient having technical difficulties OR patient did not have access to video capability.  We continued and completed visit with audio only.  Some vital signs may be absent or patient reported.    Subjective:   Samantha Landry is a 74 y.o. female who presents for Medicare Annual (Subsequent) preventive examination.  Review of Systems     Cardiac Risk Factors include: advanced age (>9men, >69 women);dyslipidemia     Objective:     Wt Readings from Last 3 Encounters:  12/03/19 140 lb (63.5 kg)  06/27/19 143 lb (64.9 kg)  12/23/18 140 lb (63.5 kg)   Temp Readings from Last 3 Encounters:  06/24/18 97.8 F (36.6 C) (Oral)  12/19/17 97.8 F (36.6 C) (Oral)  08/15/17 97.6 F (36.4 C) (Oral)   BP Readings from Last 3 Encounters:  12/03/19 114/65  06/27/19 118/60  12/23/18 122/70   Pulse Readings from Last 3 Encounters:  12/03/19 70  06/27/19 77  12/23/18 69    Advanced Directives 12/03/2019 11/29/2018 11/26/2017 06/25/2017 11/01/2016  Does Patient Have a Medical Advance Directive? Yes Yes Yes Yes Yes  Type of Paramedic of [place];Living will [place];Living will [place];Living will - [place];Living will  Does patient want to make changes to medical advance directive? No - Patient declined No - Patient declined - No - Patient declined -  Copy of [place] in Chart? No - copy requested No - copy requested No - copy requested - No - copy requested    Current Medications (verified) Outpatient Encounter Medications as of 12/03/2019  Medication Sig  . acetaminophen (TYLENOL) 325 MG tablet Take 650 mg by mouth every 6 (six) hours as needed for mild pain or moderate pain.  Marland Kitchen aspirin 81 MG tablet Take 81 mg by mouth daily.    Marland Kitchen atorvastatin (LIPITOR) 20 MG tablet Take 1 tablet (20 mg total) by mouth daily.  . Calcium-Vitamin D-Vitamin K [id number] MG-UNT-MCG TABS Take 1 tablet by mouth daily.  Marland Kitchen escitalopram (LEXAPRO) 10 MG tablet Take 1 tablet (10 mg total) by mouth daily.  . fish oil-omega-3 fatty acids 1000 MG capsule Take 1 g by mouth daily.   Marland Kitchen omeprazole (PRILOSEC) 40 MG capsule Take 1 capsule (40 mg total) by mouth daily.   Facility-Administered Encounter Medications as of 12/03/2019  Medication  . 0.9 %  sodium chloride infusion    Allergies (verified) Codeine   History: Past Medical History:  Diagnosis Date  . Allergy   . Alopecia 08/03/2011  . Anxiety   . Anxiety and depression 11/25/2015  . Arthritis   . Benign neoplasm of skin of trunk 11/14/2010  . Cancer ([place])   . Cataract   . Diverticulosis of colon   . DIVERTICULOSIS, COLON 03/26/2007   Qualifier: Diagnosis of  By:  Wynona Luna   . GERD (gastroesophageal reflux disease)   . Heart murmur   . HOT FLASHES 03/25/2007   Qualifier: Diagnosis of  By: Wynona Luna   . Hyperlipidemia   . Melanoma in situ ([place]) 02/2007   right arm  . Osteopenia 08/20/2012  . PULMONARY NODULE 03/25/2007   Qualifier: Diagnosis of  By: Wynona Luna   . Pulmonary nodules 02/2007   2-37mm  . SKIN CANCER, HX OF 03/26/2007   Qualifier: Diagnosis of  By: Wynona Luna    Past Surgical  History:  Procedure Laterality Date  . bone spur Left 11/07/2017   thumb  . BUNIONECTOMY Left   . COLONOSCOPY    . DILATION AND CURETTAGE OF UTERUS  2002   uterine fibroids  . EYE SURGERY Bilateral 2019  . LEFT HEART CATH AND CORONARY ANGIOGRAPHY N/A 06/25/2017   Procedure: LEFT HEART CATH AND CORONARY ANGIOGRAPHY;  Surgeon: Leonie Man, MD;  Location: [facility];  Service: Cardiovascular;  Laterality: N/A;  . TONSILLECTOMY     Family History  Problem Relation Age of Onset  . Lung cancer Father   . Colon cancer Cousin        1st pat.cousin  . Rectal cancer Neg Hx   . Stomach cancer Neg Hx   . Esophageal cancer Neg Hx   . Pancreatic cancer Neg Hx    Social History   Socioeconomic History  . Marital status: Married    Spouse name: Not on file  . Number of children: 2  . Years of education: Not on file  . Highest education level: Not on file  Occupational History  . Occupation: retired    Comment: Primary school teacher  Tobacco Use  . Smoking status: Never Smoker  . Smokeless tobacco: Never Used  Substance and Sexual Activity  . Alcohol use: Yes    Comment: 4-5 glasses of wine per week  . Drug use: No  . Sexual activity: Never  Other Topics Concern  . 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 Difficulty of Paying Living Expenses:   Food Insecurity:   . Worried About Charity fundraiser in the Last Year:   . Arboriculturist in the Last Year:   Transportation Needs:   . Film/video editor (Medical):   Marland Kitchen Lack of Transportation (Non-Medical):   Physical Activity:   . Days of Exercise per Week:   . Minutes of Exercise per Session:   Stress:   . Feeling of Stress :   Social Connections:   . Frequency of Communication with Friends and Family:   . Frequency of  Social Gatherings with Friends and Family:   . Attends Religious Services:   . Active Member of Clubs or Organizations:   . Attends Archivist Meetings:   Marland Kitchen Marital Status:     Tobacco Counseling Counseling given: Not Answered   Clinical Intake: Pain : No/denies pain    Activities of Daily Living In your present state of health, do you have any difficulty performing the following activities: 12/03/2019  Hearing? N  Vision? N  Difficulty concentrating or making decisions? N  Walking or climbing stairs? N  Dressing  or bathing? N  Doing errands, shopping? N  Preparing Food and eating ? N  Using the Toilet? N  In the past six months, have you accidently leaked urine? N  Do you have problems with loss of bowel control? N  Managing your Medications? N  Managing your Finances? N  Housekeeping or managing your Housekeeping? N  Some recent data might be hidden    Patient Care Team: Debbrah Alar, NP as PCP - General (Internal Medicine) Carren Rang, Nadara Mustard, MD as Consulting Physician (Gynecology)  Indicate any recent Medical Services you may have received from other than [facility] providers in the past year (date may be approximate).     Assessment:   This is a routine wellness examination for Samantha Landry.  Dietary issues and exercise activities discussed: Current Exercise Habits: Home exercise routine, Type of exercise: walking, Time (Minutes): 30, Frequency (Times/Week): 7, Weekly Exercise (Minutes/Week): 210, Intensity: Mild, Exercise limited by: None identified Diet (meal preparation, eat out, water intake, caffeinated beverages, dairy products, fruits and vegetables): well balanced   Goals    .  Continue exercising (pt-stated)    .  Maintain current healthy lifestyle.    .  Weight (lb) < 135 lb (61.2 kg)      With exercise and healthy diet.      Depression Screen PHQ 2/9 Scores 06/27/2019 06/24/2018 12/19/2017 11/26/2017 11/01/2016 10/31/2016 03/29/2016  PHQ - 2 Score 0 0  1 0 0 1 0  PHQ- 9 Score 0 2 2 - - 6 -    Fall Risk Fall Risk  12/03/2019 06/27/2019 06/27/2019 01/02/2018 11/26/2017  Falls in the past year? 0 0 - No No  Comment - - - Emmi Telephone Survey: data to providers prior to load -  Number falls in past yr: 0 0 0 - -  Injury with Fall? 0 0 0 - -  Follow up Education provided;Falls prevention discussed - - - -     Any stairs in or around the home? Yes  If so, are there any without handrails? No  Home free of loose throw rugs in walkways, pet beds, electrical cords, etc? Yes  Adequate lighting in your home to reduce risk of falls? Yes   ASSISTIVE DEVICES UTILIZED TO PREVENT FALLS:  Life alert? No  Use of a cane, walker or w/c? No  Grab bars in the bathroom? Yes  Shower chair or bench in shower? Yes  Elevated toilet seat or a handicapped toilet? Yes    Cognitive Function: Ad8 score reviewed for issues:  Issues making decisions:no  Less interest in hobbies / activities:no  Repeats questions, stories (family complaining):no  Trouble using ordinary gadgets (microwave, computer, phone): no  Forgets the month or year: no  Mismanaging finances: no  Remembering appts:no  Daily problems with thinking and/or memory:no Ad8 score is=0         Immunizations Immunization History  Administered Date(s) Administered  . Influenza Split 03/05/2012  . Influenza Whole 03/22/2010  . Influenza, High Dose Seasonal PF 02/23/2015, 02/25/2016, 02/14/2017  . Influenza-Unspecified 02/12/2017, 03/07/2018  . Moderna SARS-COVID-2 Vaccination 07/17/2019, 08/25/2019  . Pneumococcal Conjugate-13 09/22/2013  . Pneumococcal Polysaccharide-23 03/05/2012  . Tdap 10/26/2010  . Zoster 10/26/2010  . Zoster Recombinat (Shingrix) 05/02/2017    TDAP status: Up to date Pneumococcal vaccine status: Up to date Covid-19 vaccine status: Completed vaccines  Qualifies for Shingles Vaccine?   Zostavax completed Yes     Screening Tests Health Maintenance    Topic Date Due  . INFLUENZA  VACCINE  01/04/2020  . TETANUS/TDAP  10/25/2020  . MAMMOGRAM  11/04/2021  . DEXA SCAN  Completed  . COVID-19 Vaccine  Completed  . Hepatitis C Screening  Completed  . PNA vac Low Risk Adult  Completed    Health Maintenance  There are no preventive care reminders to display for this patient.  Colorectal cancer screening: Completed 06/21/17. Repeat every (no recall due to age)  years Mammogram status: Completed 11/05/19. Repeat every year Bone Density status: Completed 12/06/18. Results reflect: Bone density results: NORMAL. Repeat every 2 years.  Lung Cancer Screening: (Low Dose CT Chest recommended if Age 58-80 years, 30 pack-year currently smoking OR have quit w/in 15years.) does not qualify.     Additional Screening:  Hepatitis C Screening: does qualify; Completed 12/19/17  Vision Screening: Recommended annual ophthalmology exams for early detection of glaucoma and other disorders of the eye. Is the patient up to date with their annual eye exam?  Yes  Who is the provider or what is the name of the office in which the patient attends annual eye exams? Dr.Shapiro   Dental Screening: Recommended annual dental exams for proper oral hygiene  Community Resource Referral / Chronic Care Management: CRR required this visit?  No   CCM required this visit?  No      Plan:   See you next year!  Keep up the great work! Let us know if you need anything.  I have personally reviewed and noted the following in the patient's chart:   . Medical and social history . Use of alcohol, tobacco or illicit drugs  . Current medications and supplements . Functional ability and status . Nutritional status . Physical activity . Advanced directives . List of other physicians . Hospitalizations, surgeries, and ER visits in previous 12 months . Vitals . Screenings to include cognitive, depression, and falls . Referrals and appointments  In addition, I have reviewed  and discussed with patient certain preventive protocols, quality metrics, and best practice recommendations. A written personalized care plan for preventive services as well as general preventive health recommendations were provided to patient.    Due to this being a telephonic visit, the after visit summary with patients personalized plan was offered to patient via mail or my-chart.  Patient would like to access on my-chart  Shela Nevin, [place]   12/03/2019   Nurse Notes: Lives in 1 story home w/ husband. 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

## 2019-12-03 ENCOUNTER — Ambulatory Visit (INDEPENDENT_AMBULATORY_CARE_PROVIDER_SITE_OTHER): Payer: Medicare Other | Admitting: *Deleted

## 2019-12-03 ENCOUNTER — Encounter: Payer: Self-pay | Admitting: *Deleted

## 2019-12-03 ENCOUNTER — Other Ambulatory Visit: Payer: Self-pay

## 2019-12-03 VITALS — BP 114/65 | HR 70 | Wt 140.0 lb

## 2019-12-03 DIAGNOSIS — Z0000 Encounter for general adult medical examination without abnormal findings: Secondary | ICD-10-CM

## 2019-12-03 NOTE — Patient Instructions (Signed)
See you next year!  Keep up the great work! Let us know if you need anything.   Samantha Landry , Thank you for taking time to come for your Medicare Wellness Visit. I appreciate your ongoing commitment to your health goals. Please review the following plan we discussed and let me know if I can assist you in the future.   These are the goals we discussed: Goals    .  Continue exercising (pt-stated)    .  Maintain current healthy lifestyle.    .  Weight (lb) < 135 lb (61.2 kg)      With exercise and healthy diet.       This is a list of the screening recommended for you and due dates:  Health Maintenance  Topic Date Due  . Flu Shot  01/04/2020  . Tetanus Vaccine  10/25/2020  . Mammogram  11/04/2021  . DEXA scan (bone density measurement)  Completed  . COVID-19 Vaccine  Completed  .  Hepatitis C: One time screening is recommended by Center for Disease Control  (CDC) for  adults born from [birth year] through 1964.   Completed  . Pneumonia vaccines  Completed    Preventive Care 73 Years and Older, Female Preventive care refers to lifestyle choices and visits with your health care provider that can promote health and wellness. This includes:  A yearly physical exam. This is also called an annual well check.  Regular dental and eye exams.  Immunizations.  Screening for certain conditions.  Healthy lifestyle choices, such as diet and exercise. What can I expect for my preventive care visit? Physical exam Your health care provider will check:  Height and weight. These may be used to calculate body mass index (BMI), which is a measurement that tells if you are at a healthy weight.  Heart rate and blood pressure.  Your skin for abnormal spots. Counseling Your health care provider may ask you questions about:  Alcohol, tobacco, and drug use.  Emotional well-being.  Home and relationship well-being.  Sexual activity.  Eating habits.  History of falls.  Memory and ability to  understand (cognition).  Work and work Statistician.  Pregnancy and menstrual history. What immunizations do I need?  Influenza (flu) vaccine  This is recommended every year. Tetanus, diphtheria, and pertussis (Tdap) vaccine  You may need a Td booster every 10 years. Varicella (chickenpox) vaccine  You may need this vaccine if you have not already been vaccinated. Zoster (shingles) vaccine  You may need this after age 65. Pneumococcal conjugate (PCV13) vaccine  One dose is recommended after age 65. Pneumococcal polysaccharide (PPSV23) vaccine  One dose is recommended after age 29. Measles, mumps, and rubella (MMR) vaccine  You may need at least one dose of MMR if you were born in 1957 or later. You may also need a second dose. Meningococcal conjugate (MenACWY) vaccine  You may need this if you have certain conditions. Hepatitis A vaccine  You may need this if you have certain conditions or if you travel or work in places where you may be exposed to hepatitis A. Hepatitis B vaccine  You may need this if you have certain conditions or if you travel or work in places where you may be exposed to hepatitis B. Haemophilus influenzae type b (Hib) vaccine  You may need this if you have certain conditions. You may receive vaccines as individual doses or as more than one vaccine together in one shot (combination vaccines). Talk with your health  care provider about the risks and benefits of combination vaccines. What tests do I need? Blood tests  Lipid and cholesterol levels. These may be checked every 5 years, or more frequently depending on your overall health.  Hepatitis C test.  Hepatitis B test. Screening  Lung cancer screening. You may have this screening every year starting at age 84 if you have a 30-pack-year history of smoking and currently smoke or have quit within the past 15 years.  Colorectal cancer screening. All adults should have this screening starting at age  64 and continuing until age 29. Your health care provider may recommend screening at age 48 if you are at increased risk. You will have tests every 1-10 years, depending on your results and the type of screening test.  Diabetes screening. This is done by checking your blood sugar (glucose) after you have not eaten for a while (fasting). You may have this done every 1-3 years.  Mammogram. This may be done every 1-2 years. Talk with your health care provider about how often you should have regular mammograms.  BRCA-related cancer screening. This may be done if you have a family history of breast, ovarian, tubal, or peritoneal cancers. Other tests  Sexually transmitted disease (STD) testing.  Bone density scan. This is done to screen for osteoporosis. You may have this done starting at age 58. Follow these instructions at home: Eating and drinking  Eat a diet that includes fresh fruits and vegetables, whole grains, lean protein, and low-fat dairy products. Limit your intake of foods with high amounts of sugar, saturated fats, and salt.  Take vitamin and mineral supplements as recommended by your health care provider.  Do not drink alcohol if your health care provider tells you not to drink.  If you drink alcohol: ? Limit how much you have to 0-1 drink a day. ? Be aware of how much alcohol is in your drink. In the [place] one drink equals one 12 oz bottle of beer (355 mL), one 5 oz glass of wine (148 mL), or one 1 oz glass of hard liquor (44 mL). Lifestyle  Take daily care of your teeth and gums.  Stay active. Exercise for at least 30 minutes on 5 or more days each week.  Do not use any products that contain nicotine or tobacco, such as cigarettes, e-cigarettes, and chewing tobacco. If you need help quitting, ask your health care provider.  If you are sexually active, practice safe sex. Use a condom or other form of protection in order to prevent STIs (sexually transmitted  infections).  Talk with your health care provider about taking a low-dose aspirin or statin. What's next?  Go to your health care provider once a year for a well check visit.  Ask your health care provider how often you should have your eyes and teeth checked.  Stay up to date on all vaccines. This information is not intended to replace advice given to you by your health care provider. Make sure you discuss any questions you have with your health care provider. Document Revised: 05/16/2018 Document Reviewed: 05/16/2018 Elsevier Patient Education  2020 [employer].

## 2019-12-05 ENCOUNTER — Ambulatory Visit: Payer: Medicare Other | Admitting: *Deleted

## 2020-01-29 ENCOUNTER — Other Ambulatory Visit: Payer: Self-pay | Admitting: Family

## 2020-03-07 DIAGNOSIS — Z23 Encounter for immunization: Secondary | ICD-10-CM | POA: Diagnosis not present

## 2020-04-10 ENCOUNTER — Other Ambulatory Visit: Payer: Self-pay | Admitting: Family

## 2020-04-20 DIAGNOSIS — R2233 Localized swelling, mass and lump, upper limb, bilateral: Secondary | ICD-10-CM | POA: Diagnosis not present

## 2020-04-24 DIAGNOSIS — Z23 Encounter for immunization: Secondary | ICD-10-CM | POA: Diagnosis not present

## 2020-05-04 ENCOUNTER — Other Ambulatory Visit: Payer: Self-pay | Admitting: Family

## 2020-05-08 ENCOUNTER — Other Ambulatory Visit: Payer: Self-pay | Admitting: Family

## 2020-06-10 ENCOUNTER — Other Ambulatory Visit: Payer: Self-pay | Admitting: Family

## 2020-06-13 ENCOUNTER — Other Ambulatory Visit: Payer: Self-pay | Admitting: Family

## 2020-06-15 ENCOUNTER — Other Ambulatory Visit: Payer: Self-pay | Admitting: Family

## 2020-06-16 ENCOUNTER — Other Ambulatory Visit: Payer: Self-pay

## 2020-06-16 DIAGNOSIS — D2112 Benign neoplasm of connective and other soft tissue of left upper limb, including shoulder: Secondary | ICD-10-CM | POA: Diagnosis not present

## 2020-06-16 DIAGNOSIS — R2233 Localized swelling, mass and lump, upper limb, bilateral: Secondary | ICD-10-CM | POA: Diagnosis not present

## 2020-06-16 DIAGNOSIS — M799 Soft tissue disorder, unspecified: Secondary | ICD-10-CM | POA: Diagnosis not present

## 2020-06-16 DIAGNOSIS — D2111 Benign neoplasm of connective and other soft tissue of right upper limb, including shoulder: Secondary | ICD-10-CM | POA: Diagnosis not present

## 2020-06-21 ENCOUNTER — Ambulatory Visit: Payer: Medicare Other | Admitting: Family

## 2020-06-24 ENCOUNTER — Encounter: Payer: Self-pay | Admitting: Family

## 2020-06-24 ENCOUNTER — Other Ambulatory Visit: Payer: Self-pay

## 2020-06-24 ENCOUNTER — Ambulatory Visit (INDEPENDENT_AMBULATORY_CARE_PROVIDER_SITE_OTHER): Payer: Medicare Other | Admitting: Family

## 2020-06-24 VITALS — BP 123/66 | HR 72 | Temp 98.6°F | Resp 16 | Ht 63.0 in | Wt 144.4 lb

## 2020-06-24 DIAGNOSIS — E785 Hyperlipidemia, unspecified: Secondary | ICD-10-CM

## 2020-06-24 DIAGNOSIS — F419 Anxiety disorder, unspecified: Secondary | ICD-10-CM | POA: Diagnosis not present

## 2020-06-24 DIAGNOSIS — K219 Gastro-esophageal reflux disease without esophagitis: Secondary | ICD-10-CM

## 2020-06-24 DIAGNOSIS — F32A Depression, unspecified: Secondary | ICD-10-CM | POA: Diagnosis not present

## 2020-06-24 LAB — COMPREHENSIVE METABOLIC PANEL
ALT: 21 U/L (ref 0–35)
AST: 25 U/L (ref 0–37)
Albumin: 4.7 g/dL (ref 3.5–5.2)
Alkaline Phosphatase: 51 U/L (ref 39–117)
BUN: 15 mg/dL (ref 6–23)
CO2: 29 mEq/L (ref 19–32)
Calcium: 10.1 mg/dL (ref 8.4–10.5)
Chloride: 104 mEq/L (ref 96–112)
Creatinine, Ser: 0.78 mg/dL (ref 0.40–1.20)
GFR: 75.29 mL/min (ref >60.00)
Glucose, Bld: 92 mg/dL (ref 70–99)
Potassium: 3.9 mEq/L (ref 3.5–5.1)
Sodium: 138 mEq/L (ref 135–145)
Total Bilirubin: 0.7 mg/dL (ref 0.2–1.2)
Total Protein: 7.4 g/dL (ref 6.0–8.3)

## 2020-06-24 LAB — LIPID PANEL
Cholesterol: 143 mg/dL (ref 0–200)
HDL: 48.3 mg/dL (ref >39.00)
LDL Cholesterol: 72 mg/dL (ref 0–99)
NonHDL: 94.9
Total CHOL/HDL Ratio: 3
Triglycerides: 115 mg/dL (ref 0.0–149.0)
VLDL: 23 mg/dL (ref 0.0–40.0)

## 2020-06-24 MED ORDER — ESCITALOPRAM OXALATE 10 MG PO TABS
10.0000 mg | ORAL_TABLET | Freq: Every day | ORAL | 1 refills | Status: DC
Start: 2020-06-24 — End: 2020-06-25

## 2020-06-24 MED ORDER — OMEPRAZOLE 40 MG PO CPDR: 40.0000 mg | DELAYED_RELEASE_CAPSULE | Freq: Every day | ORAL | 1 refills | Status: DC

## 2020-06-24 MED ORDER — ATORVASTATIN CALCIUM 20 MG PO TABS: 20.0000 mg | ORAL_TABLET | Freq: Every day | ORAL | 1 refills | Status: DC

## 2020-06-24 NOTE — Patient Instructions (Signed)
Please complete lab work prior to leaving.   

## 2020-06-24 NOTE — Progress Notes (Signed)
Subjective:    Patient ID: Samantha Landry, female    DOB: 04/19/1946, 75 y.o.   MRN: [id number]  HPI  Patient is a 75 yr old female who presents today for routine follow up.  Hyperlipidemia- maintained on atorvastatin 20mg . Tolerating without difficulty. No significant myalgias.    Lab Results  Component Value Date   CHOL 156 07/09/2019   HDL 46.70 07/09/2019   LDLCALC 86 07/09/2019   LDLDIRECT 142.9 04/04/2007   TRIG 116.0 07/09/2019   CHOLHDL 3 07/09/2019   Anxiety/Stress-maintained on lexapro 10mg  once daily.   GERD-maintained on omeprazole 40mg  once daily. Reports that her symptoms are stable.       Review of Systems See HPI  Past Medical History:  Diagnosis Date  . Allergy   . Alopecia 08/03/2011  . Anxiety   . Anxiety and depression 11/25/2015  . Arthritis   . Benign neoplasm of skin of trunk 11/14/2010  . Cancer ([place])   . Cataract   . Diverticulosis of colon   . DIVERTICULOSIS, COLON 03/26/2007   Qualifier: Diagnosis of  By: Wynona Luna   . GERD (gastroesophageal reflux disease)   . Heart murmur   . HOT FLASHES 03/25/2007   Qualifier: Diagnosis of  By: Wynona Luna   . Hyperlipidemia   . Melanoma in situ ([place]) 02/2007   right arm  . Osteopenia 08/20/2012  . PULMONARY NODULE 03/25/2007   Qualifier: Diagnosis of  By: Wynona Luna   . Pulmonary nodules 02/2007   2-64mm  . SKIN CANCER, HX OF 03/26/2007   Qualifier: Diagnosis of  By: Wynona Luna      Social History   Socioeconomic History  . Marital status: Married    Spouse name: Not on file  . Number of children: 2  . Years of education: Not on file  . Highest education level: Not on file  Occupational History  . Occupation: retired    Comment: Primary school teacher  Tobacco Use  . Smoking status: Never Smoker  . Smokeless tobacco: Never Used  Substance and Sexual Activity  . Alcohol use: Yes    Comment: 4-5 glasses of wine per week  . Drug use: No  . Sexual activity: Never   Other Topics Concern  . 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 Difficulty of Paying Living Expenses: Not hard at all  Food Insecurity: No Food Insecurity  . Worried About Charity fundraiser in the Last Year: Never true  . Ran Out of Food in the Last Year: Never true  Transportation Needs: No Transportation Needs  . Lack of Transportation (Medical): No  . Lack of Transportation (Non-Medical): No  Physical Activity: Not on file  Stress: Not on file  Social Connections: Not on file  Intimate Partner Violence: Not on file    Past Surgical History:  Procedure Laterality Date  . bone spur Left 11/07/2017   thumb  . BUNIONECTOMY Left   . COLONOSCOPY    . DILATION AND CURETTAGE OF UTERUS  2002   uterine fibroids  . EYE SURGERY Bilateral 2019  . LEFT HEART CATH AND CORONARY ANGIOGRAPHY N/A 06/25/2017   Procedure: LEFT HEART CATH AND CORONARY ANGIOGRAPHY;  Surgeon: Leonie Man, MD;  Location: [facility];  Service: Cardiovascular;  Laterality: N/A;  . TONSILLECTOMY      Family History  Problem Relation Age of Onset  . Lung cancer Father   . Colon cancer Cousin        1st pat.cousin  . Rectal cancer Neg Hx   . Stomach cancer Neg Hx   . Esophageal cancer Neg Hx   . Pancreatic cancer Neg Hx     Allergies  Allergen Reactions  . Codeine Nausea And Vomiting    REACTION: Jittery    Current Outpatient Medications on File Prior to Visit  Medication Sig Dispense Refill  . acetaminophen (TYLENOL) 325 MG tablet Take 650 mg by mouth every 6 (six) hours as needed for mild pain or moderate pain.    Marland Kitchen aspirin 81 MG tablet Take 81 mg by mouth daily.    Marland Kitchen atorvastatin (LIPITOR) 20 MG tablet Take 1 tablet (20 mg total) by mouth daily. 30 tablet 0  . Calcium-Vitamin D-Vitamin  K 259-563-87 MG-UNT-MCG TABS Take 1 tablet by mouth daily.    Marland Kitchen escitalopram (LEXAPRO) 10 MG tablet Take 1 tablet (10 mg total) by mouth daily. 30 tablet 0  . fish oil-omega-3 fatty acids 1000 MG capsule Take 1 g by mouth daily.    Marland Kitchen omeprazole (PRILOSEC) 40 MG capsule Take 1 capsule by mouth once daily 90 capsule 0   Current Facility-Administered Medications on File Prior to Visit  Medication Dose Route Frequency Provider Last Rate Last Admin  . 0.9 %  sodium chloride infusion  500 mL Intravenous Once Danis, Estill Cotta III, MD        BP 123/66 (BP Location: Right Arm, Patient Position: Sitting, Cuff Size: Small)   Pulse 72   Temp 98.6 F (37 C) (Oral)   Resp 16   Ht 5\' 3"  (1.6 m)   Wt 144 lb 6.4 oz (65.5 kg)   SpO2 100%   BMI 25.58 kg/m       Objective:   Physical Exam Constitutional:      Appearance: She is well-developed and well-nourished.  Cardiovascular:     Rate and Rhythm: Normal rate and regular rhythm.     Heart sounds: Normal heart sounds. No murmur heard.   Pulmonary:     Effort: Pulmonary effort is normal. No respiratory distress.     Breath sounds: Normal breath sounds. No wheezing.  Psychiatric:        Mood and Affect: Mood and affect normal.        Behavior: Behavior normal.        Thought Content: Thought content normal.        Judgment: Judgment normal.           Assessment & Plan:  Hyperlipidemia- tolerating atorvastatin 20mg . Continue same. Obtain follow up cmet and lipid panel.  GERD- stable on omeprazole 40mg . Continue same.  Anxiety/Depression- stable on lexapro 10mg  despite increased stressors at home caring for her husband with dementia. She is working on getting more help for him.  Continue lexapro 10mg .   This visit occurred during the SARS-CoV-2 public health emergency.  Safety protocols were in place, including screening questions prior to the visit, additional usage of staff PPE, and extensive cleaning of exam room while observing  appropriate contact time as indicated for disinfecting solutions.

## 2020-06-25 ENCOUNTER — Other Ambulatory Visit: Payer: Self-pay | Admitting: *Deleted

## 2020-06-25 MED ORDER — ESCITALOPRAM OXALATE 10 MG PO TABS
10.0000 mg | ORAL_TABLET | Freq: Every day | ORAL | 1 refills | Status: DC
Start: 2020-06-25 — End: 2020-12-09

## 2020-06-26 ENCOUNTER — Encounter: Payer: Self-pay | Admitting: Family

## 2020-07-06 DIAGNOSIS — D492 Neoplasm of unspecified behavior of bone, soft tissue, and skin: Secondary | ICD-10-CM | POA: Diagnosis not present

## 2020-07-06 DIAGNOSIS — Z86018 Personal history of other benign neoplasm: Secondary | ICD-10-CM | POA: Diagnosis not present

## 2020-07-06 DIAGNOSIS — Z85820 Personal history of malignant melanoma of skin: Secondary | ICD-10-CM | POA: Diagnosis not present

## 2020-07-06 DIAGNOSIS — D225 Melanocytic nevi of trunk: Secondary | ICD-10-CM | POA: Diagnosis not present

## 2020-07-06 DIAGNOSIS — L814 Other melanin hyperpigmentation: Secondary | ICD-10-CM | POA: Diagnosis not present

## 2020-11-02 DIAGNOSIS — M519 Unspecified thoracic, thoracolumbar and lumbosacral intervertebral disc disorder: Secondary | ICD-10-CM | POA: Diagnosis not present

## 2020-11-02 DIAGNOSIS — M542 Cervicalgia: Secondary | ICD-10-CM | POA: Diagnosis not present

## 2020-11-11 DIAGNOSIS — Z1231 Encounter for screening mammogram for malignant neoplasm of breast: Secondary | ICD-10-CM | POA: Diagnosis not present

## 2020-11-11 LAB — HM MAMMOGRAPHY

## 2020-11-16 DIAGNOSIS — M542 Cervicalgia: Secondary | ICD-10-CM | POA: Diagnosis not present

## 2020-11-19 DIAGNOSIS — M542 Cervicalgia: Secondary | ICD-10-CM | POA: Diagnosis not present

## 2020-11-25 DIAGNOSIS — M542 Cervicalgia: Secondary | ICD-10-CM | POA: Diagnosis not present

## 2020-11-29 DIAGNOSIS — M542 Cervicalgia: Secondary | ICD-10-CM | POA: Diagnosis not present

## 2020-12-02 DIAGNOSIS — M542 Cervicalgia: Secondary | ICD-10-CM | POA: Diagnosis not present

## 2020-12-07 ENCOUNTER — Ambulatory Visit: Payer: Medicare Other | Admitting: *Deleted

## 2020-12-07 DIAGNOSIS — M542 Cervicalgia: Secondary | ICD-10-CM | POA: Diagnosis not present

## 2020-12-07 DIAGNOSIS — Z961 Presence of intraocular lens: Secondary | ICD-10-CM | POA: Diagnosis not present

## 2020-12-08 NOTE — Progress Notes (Signed)
Subjective:   Samantha Landry is a 75 y.o. female who presents for Medicare Annual (Subsequent) preventive examination.  Review of Systems     Cardiac Risk Factors include: advanced age (>57men, >79 women);dyslipidemia     Objective:    Today's Vitals   12/09/20 0924  BP: 138/84  Pulse: 63  Resp: 16  Temp: 97.6 F (36.4 C)  TempSrc: Temporal  SpO2: 97%  Weight: 144 lb 12.8 oz (65.7 kg)  Height: 5\' 3"  (1.6 m)   Body mass index is 25.65 kg/m.  Advanced Directives 12/09/2020 12/03/2019 11/29/2018 11/26/2017 06/25/2017 11/01/2016  Does Patient Have a Medical Advance Directive? Yes Yes Yes Yes Yes Yes  Type of Paramedic of [place];Living will [place];Living will [place];Living will [place];Living will - [place];Living will  Does patient want to make changes to medical advance directive? - No - Patient declined No - Patient declined - No - Patient declined -  Copy of [place] in Chart? No - copy requested No - copy requested No - copy requested No - copy requested - No - copy requested    Current Medications (verified) Outpatient Encounter Medications as of 12/09/2020  Medication Sig   acetaminophen (TYLENOL) 325 MG tablet Take 650 mg by mouth every 6 (six) hours as needed for mild pain or moderate pain.   aspirin 81 MG tablet Take 81 mg by mouth daily.   atorvastatin (LIPITOR) 20 MG tablet Take 1 tablet (20 mg total) by mouth daily.   Calcium-Vitamin D-Vitamin K [id number] MG-UNT-MCG TABS Take 1 tablet by mouth daily.   escitalopram (LEXAPRO) 10 MG tablet Take 1 tablet (10 mg total) by mouth daily.   fish oil-omega-3 fatty acids 1000 MG capsule Take 1 g by mouth daily.   omeprazole (PRILOSEC) 40 MG capsule Take 1 capsule (40 mg total) by mouth daily.   Facility-Administered Encounter Medications as of 12/09/2020  Medication   0.9 %  sodium chloride infusion     Allergies (verified) Codeine   History: Past Medical History:  Diagnosis Date   Allergy    Alopecia 08/03/2011   Anxiety    Anxiety and depression 11/25/2015   Arthritis    Benign neoplasm of skin of trunk 11/14/2010   Cancer ([place])    Cataract    Diverticulosis of colon    DIVERTICULOSIS, COLON 03/26/2007   Qualifier: Diagnosis of  By: Wynona Luna    GERD (gastroesophageal reflux disease)    Heart murmur    HOT FLASHES 03/25/2007   Qualifier: Diagnosis of  By: Wynona Luna    Hyperlipidemia    Melanoma in situ [facility]) 02/2007   right arm   Osteopenia 08/20/2012   PULMONARY NODULE 03/25/2007   Qualifier: Diagnosis of  By: Wynona Luna    Pulmonary nodules 02/2007   2-7mm   SKIN CANCER, HX OF 03/26/2007   Qualifier: Diagnosis of  By: Wynona Luna    Past Surgical History:  Procedure Laterality Date   bone spur Left 11/07/2017   thumb   BUNIONECTOMY Left    COLONOSCOPY     DILATION AND CURETTAGE OF UTERUS  2002   uterine fibroids   EYE SURGERY Bilateral 2019   LEFT HEART CATH AND CORONARY ANGIOGRAPHY N/A 06/25/2017   Procedure: LEFT HEART CATH AND CORONARY ANGIOGRAPHY;  Surgeon: Leonie Man, MD;  Location: [facility];  Service: Cardiovascular;  Laterality: N/A;   TONSILLECTOMY     Family History  Problem Relation Age of Onset   Lung cancer Father    Colon cancer Cousin        1st pat.cousin   Rectal cancer Neg Hx    Stomach cancer Neg Hx    Esophageal cancer Neg Hx    Pancreatic cancer Neg Hx    Social History   Socioeconomic History   Marital status: Married    Spouse name: Not on file   Number of children: 2   Years of education: Not on file   Highest education level: Not on file  Occupational History   Occupation: retired    Comment: Primary school teacher  Tobacco Use   Smoking status: Never   Smokeless tobacco: Never  Substance and Sexual Activity   Alcohol use: Yes    Comment: 4-5 glasses of wine per week   Drug  use: No   Sexual activity: Never  Other Topics Concern   Not on file  Social History Narrative   Retired housewife, 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   Daughter is PA @ orthopedic office   Son is a Pharmacologist Strain: Low Risk    Difficulty of Paying Living Expenses: Not hard at all  Food Insecurity: No Food Insecurity   Worried About Charity fundraiser in the Last Year: Never true   Arboriculturist in the Last Year: Never true  Transportation Needs: No Transportation Needs   Lack of Transportation (Medical): No   Lack of Transportation (Non-Medical): No  Physical Activity: Sufficiently Active   Days of Exercise per Week: 7 days   Minutes of Exercise per Session: 40 min  Stress: No Stress Concern Present   Feeling of Stress : Only a little  Social Connections: Moderately Integrated   Frequency of Communication with Friends and Family: More than three times a week   Frequency of Social Gatherings with Friends and Family: More than three times a week   Attends Religious Services: More than 4 times per year   Active Member of [employer] or Organizations: No   Attends Music therapist: Never   Marital Status: Married    Tobacco Counseling Counseling given: Not Answered   Clinical Intake:  Pre-visit preparation completed: Yes  Pain : No/denies pain     Nutritional Status: BMI of 19-24  Normal Nutritional Risks: None Diabetes: No  How often do you need to have someone help you when you read instructions, pamphlets, or other written materials from your doctor or pharmacy?: 1 - Never  Diabetic?No  Interpreter Needed?: No  Information entered by :: Caroleen Hamman LPN   Activities of Daily Living In your present state of health, do you have any difficulty performing the following activities: 12/09/2020  Hearing? N  Vision? N  Difficulty concentrating or making decisions? N  Walking or  climbing stairs? N  Dressing or bathing? N  Doing errands, shopping? N  Preparing Food and eating ? N  Using the Toilet? N  In the past six months, have you accidently leaked urine? N  Do you have problems with loss of bowel control? N  Managing your Medications? N  Managing your Finances? N  Housekeeping or managing your Housekeeping? N  Some recent data might be hidden    Patient Care Team: Debbrah Alar, NP as PCP - General (  Internal Medicine) Carren Rang, Nadara Mustard, MD as Consulting Physician (Gynecology)  Indicate any recent Medical Services you may have received from other than [facility] providers in the past year (date may be approximate).     Assessment:   This is a routine wellness examination for [place].  Hearing/Vision screen Hearing Screening - Comments:: C/o mild hearing loss Vision Screening - Comments:: Reading glasses Last eye exam-12/2020-Dr Gershon Crane  Dietary issues and exercise activities discussed: Current Exercise Habits: Home exercise routine, Type of exercise: walking, Time (Minutes): 40, Frequency (Times/Week): 7, Weekly Exercise (Minutes/Week): 280, Intensity: Mild, Exercise limited by: None identified   Goals Addressed               This Visit's Progress     Patient Stated     Continue exercising (pt-stated)   On track     Other     Maintain current healthy lifestyle.   On track      Depression Screen PHQ 2/9 Scores 12/09/2020 06/24/2020 12/03/2019 06/27/2019 06/24/2018 12/19/2017 11/26/2017  PHQ - 2 Score 0 0 0 0 0 1 0  PHQ- 9 Score - 3 - 0 2 2 -    Fall Risk Fall Risk  12/09/2020 12/03/2019 06/27/2019 06/27/2019 01/02/2018  Falls in the past year? 1 0 0 - No  Comment - - - - Emmi Telephone Survey: data to providers prior to load  Number falls in past yr: 1 0 0 0 -  Injury with Fall? 1 0 0 0 -  Follow up Falls prevention discussed Education provided;Falls prevention discussed - - -    FALL RISK PREVENTION PERTAINING TO THE HOME:  Any stairs in or  around the home? Yes  If so, are there any without handrails? No  Home free of loose throw rugs in walkways, pet beds, electrical cords, etc? Yes  Adequate lighting in your home to reduce risk of falls? Yes   ASSISTIVE DEVICES UTILIZED TO PREVENT FALLS:  Life alert? No  Use of a cane, walker or w/c? No  Grab bars in the bathroom? Yes  Shower chair or bench in shower? No  Elevated toilet seat or a handicapped toilet? No   TIMED UP AND GO:  Was the test performed? Yes .  Length of time to ambulate 10 feet: 10 sec.   Gait steady and fast without use of assistive device  Cognitive Function:Normal cognitive status assessed by direct observation by this Nurse Health Advisor. No abnormalities found.          Immunizations Immunization History  Administered Date(s) Administered   Influenza Split 03/05/2012   Influenza Whole 03/22/2010   Influenza, High Dose Seasonal PF 02/23/2015, 02/25/2016, 02/14/2017   Influenza-Unspecified 02/12/2017, 03/07/2018   Moderna Sars-Covid-2 Vaccination 07/17/2019, 08/25/2019, 04/19/2020   Pneumococcal Conjugate-13 09/22/2013   Pneumococcal Polysaccharide-23 03/05/2012   Tdap 10/26/2010   Zoster Recombinat (Shingrix) 05/02/2017   Zoster, Live 10/26/2010    TDAP status: Due, Education has been provided regarding the importance of this vaccine. Advised may receive this vaccine at local pharmacy or Health Dept. Aware to provide a copy of the vaccination record if obtained from local pharmacy or Health Dept. Verbalized acceptance and understanding.  Flu Vaccine status: Up to date  Pneumococcal vaccine status: Up to date  Covid-19 vaccine status: Completed vaccines  Qualifies for Shingles Vaccine? No   Zostavax completed Yes   Shingrix Completed?: Yes  Screening Tests Health Maintenance  Topic Date Due   Zoster Vaccines- Shingrix (2 of 2) 06/27/2017   COVID-19  Vaccine (4 - Booster for Moderna series) 07/20/2020   TETANUS/TDAP  10/25/2020    INFLUENZA VACCINE  01/03/2021   MAMMOGRAM  11/12/2022   DEXA SCAN  Completed   Hepatitis C Screening  Completed   PNA vac Low Risk Adult  Completed   HPV VACCINES  Aged Out    Health Maintenance  Health Maintenance Due  Topic Date Due   Zoster Vaccines- Shingrix (2 of 2) 06/27/2017   COVID-19 Vaccine (4 - Booster for Moderna series) 07/20/2020   TETANUS/TDAP  10/25/2020    Colorectal cancer screening: No longer required.   Mammogram status: Completed Bilateral 11/11/2020. Repeat every year  Bone Density status: Completed 12/26/2018. Results reflect: Bone density results: NORMAL. Repeat every 2 years.  Lung Cancer Screening: (Low Dose CT Chest recommended if Age 22-80 years, 30 pack-year currently smoking OR have quit w/in 15years.) does not qualify.     Additional Screening:  Hepatitis C Screening: Completed 12/19/2017  Vision Screening: Recommended annual ophthalmology exams for early detection of glaucoma and other disorders of the eye. Is the patient up to date with their annual eye exam?  Yes  Who is the provider or what is the name of the office in which the patient attends annual eye exams? Dr Gershon Crane   Dental Screening: Recommended annual dental exams for proper oral hygiene  Community Resource Referral / Chronic Care Management: CRR required this visit?  No   CCM required this visit?  No      Plan:     I have personally reviewed and noted the following in the patient's chart:   Medical and social history Use of alcohol, tobacco or illicit drugs  Current medications and supplements including opioid prescriptions.  Functional ability and status Nutritional status Physical activity Advanced directives List of other physicians Hospitalizations, surgeries, and ER visits in previous 12 months Vitals Screenings to include cognitive, depression, and falls Referrals and appointments  In addition, I have reviewed and discussed with patient certain preventive  protocols, quality metrics, and best practice recommendations. A written personalized care plan for preventive services as well as general preventive health recommendations were provided to patient.     Marta Antu, LPN   12/06/2593  Nurse Health Advisor  Nurse Notes: None

## 2020-12-09 ENCOUNTER — Telehealth: Payer: Self-pay

## 2020-12-09 ENCOUNTER — Other Ambulatory Visit: Payer: Self-pay

## 2020-12-09 ENCOUNTER — Ambulatory Visit (INDEPENDENT_AMBULATORY_CARE_PROVIDER_SITE_OTHER): Payer: Medicare Other

## 2020-12-09 VITALS — BP 138/84 | HR 63 | Temp 97.6°F | Resp 16 | Ht 63.0 in | Wt 144.8 lb

## 2020-12-09 DIAGNOSIS — Z0000 Encounter for general adult medical examination without abnormal findings: Secondary | ICD-10-CM | POA: Diagnosis not present

## 2020-12-09 DIAGNOSIS — M542 Cervicalgia: Secondary | ICD-10-CM | POA: Diagnosis not present

## 2020-12-09 MED ORDER — ATORVASTATIN CALCIUM 20 MG PO TABS: 20.0000 mg | ORAL_TABLET | Freq: Every day | ORAL | 0 refills | Status: DC

## 2020-12-09 MED ORDER — ESCITALOPRAM OXALATE 10 MG PO TABS: 10.0000 mg | ORAL_TABLET | Freq: Every day | ORAL | 0 refills | Status: DC

## 2020-12-09 MED ORDER — OMEPRAZOLE 40 MG PO CPDR: 40.0000 mg | DELAYED_RELEASE_CAPSULE | Freq: Every day | ORAL | 0 refills | Status: DC

## 2020-12-09 NOTE — Telephone Encounter (Signed)
Patient is requesting refills of Atorvastatin, Lexapro & Omeprazole. She has made an appt to be seen in August.

## 2020-12-09 NOTE — Telephone Encounter (Signed)
Refills sent

## 2020-12-09 NOTE — Patient Instructions (Signed)
Samantha Landry , Thank you for taking time to come for your Medicare Wellness Visit. I appreciate your ongoing commitment to your health goals. Please review the following plan we discussed and let me know if I can assist you in the future.   Screening recommendations/referrals: Colonoscopy: No longer required Mammogram: Completed 11/11/2020-Due 11/11/2021 Bone Density: Scheduled for later this month. Recommended yearly ophthalmology/optometry visit for glaucoma screening and checkup Recommended yearly dental visit for hygiene and checkup  Vaccinations: Influenza vaccine: Up to date Pneumococcal vaccine: Up to date Tdap vaccine: Discuss with pharmacy Shingles vaccine: Completed vaccines   Covid-19:2nd booster due-May obtain vaccine at your local pharmacy  Advanced directives: Please bring a copy for your chart  Conditions/risks identified: See problem list  Next appointment: Follow up in one year for your annual wellness visit 12/14/2021 @ 8:20   Preventive Care 65 Years and Older, Female Preventive care refers to lifestyle choices and visits with your health care provider that can promote health and wellness. What does preventive care include? A yearly physical exam. This is also called an annual well check. Dental exams once or twice a year. Routine eye exams. Ask your health care provider how often you should have your eyes checked. Personal lifestyle choices, including: Daily care of your teeth and gums. Regular physical activity. Eating a healthy diet. Avoiding tobacco and drug use. Limiting alcohol use. Practicing safe sex. Taking low-dose aspirin every day. Taking vitamin and mineral supplements as recommended by your health care provider. What happens during an annual well check? The services and screenings done by your health care provider during your annual well check will depend on your age, overall health, lifestyle risk factors, and family history of disease. Counseling   Your health care provider may ask you questions about your: Alcohol use. Tobacco use. Drug use. Emotional well-being. Home and relationship well-being. Sexual activity. Eating habits. History of falls. Memory and ability to understand (cognition). Work and work Statistician. Reproductive health. Screening  You may have the following tests or measurements: Height, weight, and BMI. Blood pressure. Lipid and cholesterol levels. These may be checked every 5 years, or more frequently if you are over 35 years old. Skin check. Lung cancer screening. You may have this screening every year starting at age 74 if you have a 30-pack-year history of smoking and currently smoke or have quit within the past 15 years. Fecal occult blood test (FOBT) of the stool. You may have this test every year starting at age 74. Flexible sigmoidoscopy or colonoscopy. You may have a sigmoidoscopy every 5 years or a colonoscopy every 10 years starting at age 74. Hepatitis C blood test. Hepatitis B blood test. Sexually transmitted disease (STD) testing. Diabetes screening. This is done by checking your blood sugar (glucose) after you have not eaten for a while (fasting). You may have this done every 1-3 years. Bone density scan. This is done to screen for osteoporosis. You may have this done starting at age 74. Mammogram. This may be done every 1-2 years. Talk to your health care provider about how often you should have regular mammograms. Talk with your health care provider about your test results, treatment options, and if necessary, the need for more tests. Vaccines  Your health care provider may recommend certain vaccines, such as: Influenza vaccine. This is recommended every year. Tetanus, diphtheria, and acellular pertussis (Tdap, Td) vaccine. You may need a Td booster every 10 years. Zoster vaccine. You may need this after age 74. Pneumococcal 13-valent  conjugate (PCV13) vaccine. One dose is recommended  after age 74. Pneumococcal polysaccharide (PPSV23) vaccine. One dose is recommended after age 74. Talk to your health care provider about which screenings and vaccines you need and how often you need them. This information is not intended to replace advice given to you by your health care provider. Make sure you discuss any questions you have with your health care provider. Document Released: 06/18/2015 Document Revised: 02/09/2016 Document Reviewed: 03/23/2015 Elsevier Interactive Patient Education  2017 [place] Prevention in the Home Falls can cause injuries. They can happen to people of all ages. There are many things you can do to make your home safe and to help prevent falls. What can I do on the outside of my home? Regularly fix the edges of walkways and driveways and fix any cracks. Remove anything that might make you trip as you walk through a door, such as a raised step or threshold. Trim any bushes or trees on the path to your home. Use bright outdoor lighting. Clear any walking paths of anything that might make someone trip, such as rocks or tools. Regularly check to see if handrails are loose or broken. Make sure that both sides of any steps have handrails. Any raised decks and porches should have guardrails on the edges. Have any leaves, snow, or ice cleared regularly. Use sand or salt on walking paths during winter. Clean up any spills in your garage right away. This includes oil or grease spills. What can I do in the bathroom? Use night lights. Install grab bars by the toilet and in the tub and shower. Do not use towel bars as grab bars. Use non-skid mats or decals in the tub or shower. If you need to sit down in the shower, use a plastic, non-slip stool. Keep the floor dry. Clean up any water that spills on the floor as soon as it happens. Remove soap buildup in the tub or shower regularly. Attach bath mats securely with double-sided non-slip rug tape. Do not  have throw rugs and other things on the floor that can make you trip. What can I do in the bedroom? Use night lights. Make sure that you have a light by your bed that is easy to reach. Do not use any sheets or blankets that are too big for your bed. They should not hang down onto the floor. Have a firm chair that has side arms. You can use this for support while you get dressed. Do not have throw rugs and other things on the floor that can make you trip. What can I do in the kitchen? Clean up any spills right away. Avoid walking on wet floors. Keep items that you use a lot in easy-to-reach places. If you need to reach something above you, use a strong step stool that has a grab bar. Keep electrical cords out of the way. Do not use floor polish or wax that makes floors slippery. If you must use wax, use non-skid floor wax. Do not have throw rugs and other things on the floor that can make you trip. What can I do with my stairs? Do not leave any items on the stairs. Make sure that there are handrails on both sides of the stairs and use them. Fix handrails that are broken or loose. Make sure that handrails are as long as the stairways. Check any carpeting to make sure that it is firmly attached to the stairs. Fix any carpet that  is loose or worn. Avoid having throw rugs at the top or bottom of the stairs. If you do have throw rugs, attach them to the floor with carpet tape. Make sure that you have a light switch at the top of the stairs and the bottom of the stairs. If you do not have them, ask someone to add them for you. What else can I do to help prevent falls? Wear shoes that: Do not have high heels. Have rubber bottoms. Are comfortable and fit you well. Are closed at the toe. Do not wear sandals. If you use a stepladder: Make sure that it is fully opened. Do not climb a closed stepladder. Make sure that both sides of the stepladder are locked into place. Ask someone to hold it for  you, if possible. Clearly mark and make sure that you can see: Any grab bars or handrails. First and last steps. Where the edge of each step is. Use tools that help you move around (mobility aids) if they are needed. These include: Canes. Walkers. Scooters. Crutches. Turn on the lights when you go into a dark area. Replace any light bulbs as soon as they burn out. Set up your furniture so you have a clear path. Avoid moving your furniture around. If any of your floors are uneven, fix them. If there are any pets around you, be aware of where they are. Review your medicines with your doctor. Some medicines can make you feel dizzy. This can increase your chance of falling. Ask your doctor what other things that you can do to help prevent falls. This information is not intended to replace advice given to you by your health care provider. Make sure you discuss any questions you have with your health care provider. Document Released: 03/18/2009 Document Revised: 10/28/2015 Document Reviewed: 06/26/2014 Elsevier Interactive Patient Education  2017 [employer].

## 2020-12-13 DIAGNOSIS — M542 Cervicalgia: Secondary | ICD-10-CM | POA: Diagnosis not present

## 2020-12-16 DIAGNOSIS — M542 Cervicalgia: Secondary | ICD-10-CM | POA: Diagnosis not present

## 2020-12-21 DIAGNOSIS — M542 Cervicalgia: Secondary | ICD-10-CM | POA: Diagnosis not present

## 2020-12-27 DIAGNOSIS — Z780 Asymptomatic menopausal state: Secondary | ICD-10-CM | POA: Diagnosis not present

## 2020-12-27 DIAGNOSIS — M542 Cervicalgia: Secondary | ICD-10-CM | POA: Diagnosis not present

## 2020-12-30 DIAGNOSIS — M542 Cervicalgia: Secondary | ICD-10-CM | POA: Diagnosis not present

## 2021-01-10 DIAGNOSIS — M542 Cervicalgia: Secondary | ICD-10-CM | POA: Diagnosis not present

## 2021-01-11 ENCOUNTER — Ambulatory Visit: Payer: Medicare Other | Admitting: Family

## 2021-01-13 DIAGNOSIS — M542 Cervicalgia: Secondary | ICD-10-CM | POA: Diagnosis not present

## 2021-01-14 ENCOUNTER — Other Ambulatory Visit: Payer: Self-pay | Admitting: Family

## 2021-01-18 DIAGNOSIS — M542 Cervicalgia: Secondary | ICD-10-CM | POA: Diagnosis not present

## 2021-01-21 DIAGNOSIS — M542 Cervicalgia: Secondary | ICD-10-CM | POA: Diagnosis not present

## 2021-01-25 ENCOUNTER — Other Ambulatory Visit: Payer: Self-pay

## 2021-01-25 ENCOUNTER — Ambulatory Visit (INDEPENDENT_AMBULATORY_CARE_PROVIDER_SITE_OTHER): Payer: Medicare Other | Admitting: Family

## 2021-01-25 DIAGNOSIS — K219 Gastro-esophageal reflux disease without esophagitis: Secondary | ICD-10-CM | POA: Diagnosis not present

## 2021-01-25 DIAGNOSIS — F32A Depression, unspecified: Secondary | ICD-10-CM | POA: Diagnosis not present

## 2021-01-25 DIAGNOSIS — M542 Cervicalgia: Secondary | ICD-10-CM | POA: Diagnosis not present

## 2021-01-25 DIAGNOSIS — E785 Hyperlipidemia, unspecified: Secondary | ICD-10-CM | POA: Diagnosis not present

## 2021-01-25 DIAGNOSIS — F419 Anxiety disorder, unspecified: Secondary | ICD-10-CM | POA: Diagnosis not present

## 2021-01-25 MED ORDER — ESCITALOPRAM OXALATE 10 MG PO TABS: 10.0000 mg | ORAL_TABLET | Freq: Every day | ORAL | 1 refills | Status: DC

## 2021-01-25 MED ORDER — ATORVASTATIN CALCIUM 20 MG PO TABS: 20.0000 mg | ORAL_TABLET | Freq: Every day | ORAL | 1 refills | Status: DC

## 2021-01-25 MED ORDER — OMEPRAZOLE 40 MG PO CPDR: 40.0000 mg | DELAYED_RELEASE_CAPSULE | Freq: Every day | ORAL | 1 refills | Status: DC

## 2021-01-25 MED ORDER — TETANUS-DIPHTHERIA TOXOIDS TD 5-2 LFU IM INJ: 0.5000 mL | INJECTION | Freq: Once | INTRAMUSCULAR | 0 refills | Status: AC

## 2021-01-25 NOTE — Assessment & Plan Note (Signed)
LDL nearly at goal at 72. Continue atorvastatin 20 mg once daily.

## 2021-01-25 NOTE — Progress Notes (Signed)
Subjective:     Patient ID: Samantha Landry, female    DOB: 11-Jan-1946, 75 y.o.   MRN: [id number]  Chief Complaint  Patient presents with   Anxiety    Here for follow up, "doing well"    HPI   Patient is in today for follow up.   Anxiety/depression- pt continues lexapro '10mg'$  once daily. Reports that her mood remains good at this dose.  GERD- pt continues omeprazole '40mg'$  once daily. Has recurrence of symptoms when she has tried to discontinue.   Hyperlipidemia- maintained on atorvastatin 20 mg once daily.   Lab Results  Component Value Date   CHOL 143 06/24/2020   HDL 48.30 06/24/2020   LDLCALC 72 06/24/2020   LDLDIRECT 142.9 04/04/2007   TRIG 115.0 06/24/2020   CHOLHDL 3 06/24/2020     Health Maintenance Due  Topic Date Due   COVID-19 Vaccine (4 - Booster for Moderna series) 07/20/2020   TETANUS/TDAP  10/25/2020   INFLUENZA VACCINE  01/03/2021    Past Medical History:  Diagnosis Date   Allergy    Alopecia 08/03/2011   Anxiety    Anxiety and depression 11/25/2015   Arthritis    Benign neoplasm of skin of trunk 11/14/2010   Cancer ([place])    Cataract    Diverticulosis of colon    DIVERTICULOSIS, COLON 03/26/2007   Qualifier: Diagnosis of  By: Wynona Luna    GERD (gastroesophageal reflux disease)    Heart murmur    HOT FLASHES 03/25/2007   Qualifier: Diagnosis of  By: Wynona Luna    Hyperlipidemia    Melanoma in situ [facility]) 02/2007   right arm   Osteopenia 08/20/2012   PULMONARY NODULE 03/25/2007   Qualifier: Diagnosis of  By: Wynona Luna    Pulmonary nodules 02/2007   2-24m   SKIN CANCER, HX OF 03/26/2007   Qualifier: Diagnosis of  By: YWynona Luna    Past Surgical History:  Procedure Laterality Date   bone spur Left 11/07/2017   thumb   BUNIONECTOMY Left    COLONOSCOPY     DILATION AND CURETTAGE OF UTERUS  2002   uterine fibroids   EYE SURGERY Bilateral 2019   LEFT HEART CATH AND CORONARY ANGIOGRAPHY N/A 06/25/2017    Procedure: LEFT HEART CATH AND CORONARY ANGIOGRAPHY;  Surgeon: HLeonie Man MD;  [facility];  Service: Cardiovascular;  Laterality: N/A;   TONSILLECTOMY      Family History  Problem Relation Age of Onset   Lung cancer Father    Colon cancer Cousin        1st pat.cousin   Rectal cancer Neg Hx    Stomach cancer Neg Hx    Esophageal cancer Neg Hx    Pancreatic cancer Neg Hx     Social History   Socioeconomic History   Marital status: Married    Spouse name: Not on file   Number of children: 2   Years of education: Not on file   Highest education level: Not on file  Occupational History   Occupation: retired    Comment: TPrimary school teacher Tobacco Use   Smoking status: Never   Smokeless tobacco: Never  Substance and Sexual Activity   Alcohol use: Yes    Comment: 4-5 glasses of wine per week   Drug use: No   Sexual activity: Never  Other Topics Concern   Not on file  Social History Narrative  Retired housewife, 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   Daughter is PA @ orthopedic office   Son is a Programmer, applications: Low Risk    Difficulty of Paying Living Expenses: Not hard at all  Food Insecurity: No Food Insecurity   Worried About Charity fundraiser in the Last Year: Never true   Arboriculturist in the Last Year: Never true  Transportation Needs: No Transportation Needs   Lack of Transportation (Medical): No   Lack of Transportation (Non-Medical): No  Physical Activity: Sufficiently Active   Days of Exercise per Week: 7 days   Minutes of Exercise per Session: 40 min  Stress: No Stress Concern Present   Feeling of Stress : Only a little  Social Connections: Moderately Integrated   Frequency of Communication with Friends and Family: More than three times a week   Frequency of Social Gatherings with Friends and Family: More than three times a week   Attends Religious Services:  More than 4 times per year   Active Member of [employer] or Organizations: No   Attends Archivist Meetings: Never   Marital Status: Married  Human resources officer Violence: Not At Risk   Fear of Current or Ex-Partner: No   Emotionally Abused: No   Physically Abused: No   Sexually Abused: No    Outpatient Medications Prior to Visit  Medication Sig Dispense Refill   acetaminophen (TYLENOL) 325 MG tablet Take 650 mg by mouth every 6 (six) hours as needed for mild pain or moderate pain.     aspirin 81 MG tablet Take 81 mg by mouth daily.     Calcium-Vitamin D-Vitamin K [id number] MG-UNT-MCG TABS Take 1 tablet by mouth daily.     fish oil-omega-3 fatty acids 1000 MG capsule Take 1 g by mouth daily.     atorvastatin (LIPITOR) 20 MG tablet Take 1 tablet (20 mg total) by mouth daily. 90 tablet 0   escitalopram (LEXAPRO) 10 MG tablet Take 1 tablet (10 mg total) by mouth daily. 90 tablet 0   omeprazole (PRILOSEC) 40 MG capsule Take 1 capsule by mouth once daily 90 capsule 0   Facility-Administered Medications Prior to Visit  Medication Dose Route Frequency Provider Last Rate Last Admin   0.9 %  sodium chloride infusion  500 mL Intravenous Once Nelida Meuse III, MD        Allergies  Allergen Reactions   Codeine Nausea And Vomiting    REACTION: Jittery    ROS    See HPI  Objective:    Physical Exam Constitutional:      General: She is not in acute distress.    Appearance: Normal appearance. She is well-developed.  HENT:     Head: Normocephalic and atraumatic.     Right Ear: External ear normal.     Left Ear: External ear normal.  Eyes:     General: No scleral icterus. Neck:     Thyroid: No thyromegaly.  Cardiovascular:     Rate and Rhythm: Normal rate and regular rhythm.     Heart sounds: Normal heart sounds. No murmur heard. Pulmonary:     Effort: Pulmonary effort is normal. No respiratory distress.     Breath sounds: Normal breath sounds. No wheezing.   Musculoskeletal:     Cervical back: Neck supple.  Skin:    General: Skin is warm and dry.  Neurological:  Mental Status: She is alert and oriented to person, place, and time.  Psychiatric:        Mood and Affect: Mood normal.        Behavior: Behavior normal.        Thought Content: Thought content normal.        Judgment: Judgment normal.    BP 131/63 (BP Location: Right Arm, Patient Position: Sitting, Cuff Size: Small)   Pulse 62   Temp 98.2 F (36.8 C) (Oral)   Resp 16   Wt 145 lb (65.8 kg)   SpO2 100%   BMI 25.69 kg/m  Wt Readings from Last 3 Encounters:  01/25/21 145 lb (65.8 kg)  12/09/20 144 lb 12.8 oz (65.7 kg)  06/24/20 144 lb 6.4 oz (65.5 kg)       Assessment & Plan:   Problem List Items Addressed This Visit       Unprioritized   Hyperlipidemia with target low density lipoprotein (LDL) cholesterol less than 70 mg/dL (Chronic)    LDL nearly at goal at 73. Continue atorvastatin 20 mg once daily.       Relevant Medications   atorvastatin (LIPITOR) 20 MG tablet   GERD (gastroesophageal reflux disease)    Stable on omeprazole 40 mg once daily. Continue same.       Relevant Medications   omeprazole (PRILOSEC) 40 MG capsule   Anxiety and depression    Stable on lexapro 10 mg once daily.       Relevant Medications   escitalopram (LEXAPRO) 10 MG tablet    I have changed Arbie Cookey E. Leisey's omeprazole. I am also having her start on tetanus & diphtheria toxoids (adult). Additionally, I am having her maintain her aspirin, fish oil-omega-3 fatty acids, Calcium-Vitamin D-Vitamin K, acetaminophen, escitalopram, and atorvastatin. We will continue to administer sodium chloride.  Meds ordered this encounter  Medications   omeprazole (PRILOSEC) 40 MG capsule    Sig: Take 1 capsule (40 mg total) by mouth daily.    Dispense:  90 capsule    Refill:  1    Order Specific Question:   Supervising Provider    Answer:   Penni Homans A [[id number]]   escitalopram (LEXAPRO)  10 MG tablet    Sig: Take 1 tablet (10 mg total) by mouth daily.    Dispense:  90 tablet    Refill:  1    Order Specific Question:   Supervising Provider    Answer:   Penni Homans A [[id number]]   atorvastatin (LIPITOR) 20 MG tablet    Sig: Take 1 tablet (20 mg total) by mouth daily.    Dispense:  90 tablet    Refill:  1    Order Specific Question:   Supervising Provider    Answer:   Penni Homans A [[id number]]   tetanus & diphtheria toxoids, adult, (TENIVAC) 5-2 LFU injection    Sig: Inject 0.5 mLs into the muscle once for 1 dose.    Dispense:  0.5 mL    Refill:  0    Order Specific Question:   Supervising Provider    Answer:   Penni Homans A [[id number]]

## 2021-01-25 NOTE — Assessment & Plan Note (Signed)
Stable on lexapro 10 mg once daily.

## 2021-01-25 NOTE — Assessment & Plan Note (Signed)
Stable on omeprazole 40mg once daily. Continue same.  

## 2021-01-28 DIAGNOSIS — M542 Cervicalgia: Secondary | ICD-10-CM | POA: Diagnosis not present

## 2021-03-17 DIAGNOSIS — K219 Gastro-esophageal reflux disease without esophagitis: Secondary | ICD-10-CM | POA: Diagnosis not present

## 2021-03-17 DIAGNOSIS — R053 Chronic cough: Secondary | ICD-10-CM | POA: Diagnosis not present

## 2021-03-17 DIAGNOSIS — Z23 Encounter for immunization: Secondary | ICD-10-CM | POA: Diagnosis not present

## 2021-07-12 DIAGNOSIS — R2232 Localized swelling, mass and lump, left upper limb: Secondary | ICD-10-CM | POA: Diagnosis not present

## 2021-07-29 ENCOUNTER — Ambulatory Visit (INDEPENDENT_AMBULATORY_CARE_PROVIDER_SITE_OTHER): Payer: Medicare Other | Admitting: Family

## 2021-07-29 ENCOUNTER — Encounter: Payer: Self-pay | Admitting: Family

## 2021-07-29 VITALS — BP 133/53 | HR 70 | Temp 98.3°F | Resp 16 | Wt 147.0 lb

## 2021-07-29 DIAGNOSIS — K219 Gastro-esophageal reflux disease without esophagitis: Secondary | ICD-10-CM

## 2021-07-29 DIAGNOSIS — F32A Depression, unspecified: Secondary | ICD-10-CM

## 2021-07-29 DIAGNOSIS — E785 Hyperlipidemia, unspecified: Secondary | ICD-10-CM

## 2021-07-29 DIAGNOSIS — F419 Anxiety disorder, unspecified: Secondary | ICD-10-CM | POA: Diagnosis not present

## 2021-07-29 LAB — COMPREHENSIVE METABOLIC PANEL
ALT: 18 U/L (ref 0–35)
AST: 22 U/L (ref 0–37)
Albumin: 4.4 g/dL (ref 3.5–5.2)
Alkaline Phosphatase: 54 U/L (ref 39–117)
BUN: 17 mg/dL (ref 6–23)
CO2: 33 mEq/L — ABNORMAL HIGH (ref 19–32)
Calcium: 9.8 mg/dL (ref 8.4–10.5)
Chloride: 104 mEq/L (ref 96–112)
Creatinine, Ser: 0.72 mg/dL (ref 0.40–1.20)
GFR: 82.25 mL/min (ref >60.00)
Glucose, Bld: 89 mg/dL (ref 70–99)
Potassium: 4 mEq/L (ref 3.5–5.1)
Sodium: 139 mEq/L (ref 135–145)
Total Bilirubin: 0.7 mg/dL (ref 0.2–1.2)
Total Protein: 7 g/dL (ref 6.0–8.3)

## 2021-07-29 LAB — LIPID PANEL
Cholesterol: 166 mg/dL (ref 0–200)
HDL: 50.8 mg/dL (ref >39.00)
LDL Cholesterol: 87 mg/dL (ref 0–99)
NonHDL: 115.64
Total CHOL/HDL Ratio: 3
Triglycerides: 145 mg/dL (ref 0.0–149.0)
VLDL: 29 mg/dL (ref 0.0–40.0)

## 2021-07-29 MED ORDER — OMEPRAZOLE 40 MG PO CPDR: 40.0000 mg | DELAYED_RELEASE_CAPSULE | Freq: Every day | ORAL | 1 refills | Status: DC

## 2021-07-29 MED ORDER — ATORVASTATIN CALCIUM 20 MG PO TABS: 20.0000 mg | ORAL_TABLET | Freq: Every day | ORAL | 1 refills | Status: DC

## 2021-07-29 MED ORDER — ESCITALOPRAM OXALATE 10 MG PO TABS: 10.0000 mg | ORAL_TABLET | Freq: Every day | ORAL | 1 refills | Status: DC

## 2021-07-29 NOTE — Assessment & Plan Note (Signed)
Tolerating statin. Obtain follow up lipid panel.  

## 2021-07-29 NOTE — Assessment & Plan Note (Signed)
Stable on lexapro 10mg . Continue same.

## 2021-07-29 NOTE — Assessment & Plan Note (Signed)
Stable on omeprazole. Has not tolerated skipping doses due to recurrent symptoms.

## 2021-07-29 NOTE — Progress Notes (Signed)
Subjective:     Patient ID: Samantha Landry, female    DOB: [birth year], 76 y.o.   MRN: [id number]  Chief Complaint  Patient presents with   Anxiety    Here for follow up    Anxiety    Patient is in today for follow up.  Anxiety/depression- still caregiver for her husband with dementia.She notes that his dementia is worsening which is stressful.  Overall however, her mood has been very good on lexapro 10 mg.   She had covid in September.  She has fully recovered.    Hyperlipidemia-  continues lipitor without side effects.   Lab Results  Component Value Date   CHOL 143 06/24/2020   HDL 48.30 06/24/2020   LDLCALC 72 06/24/2020   LDLDIRECT 142.9 04/04/2007   TRIG 115.0 06/24/2020   CHOLHDL 3 06/24/2020   GERD- continues omeprazole.    Health Maintenance Due  Topic Date Due   TETANUS/TDAP  10/25/2020    Past Medical History:  Diagnosis Date   Allergy    Alopecia 08/03/2011   Anxiety    Anxiety and depression 11/25/2015   Arthritis    Benign neoplasm of skin of trunk 11/14/2010   Cancer (HCC)    Cataract    Diverticulosis of colon    DIVERTICULOSIS, COLON 03/26/2007   Qualifier: Diagnosis of  By: Nena Jordan    GERD (gastroesophageal reflux disease)    Heart murmur    HOT FLASHES 03/25/2007   Qualifier: Diagnosis of  By: Nena Jordan    Hyperlipidemia    Melanoma in situ [facility]) 02/2007   right arm   Osteopenia 08/20/2012   PULMONARY NODULE 03/25/2007   Qualifier: Diagnosis of  By: Nena Jordan    Pulmonary nodules 02/2007   2-28mm   SKIN CANCER, HX OF 03/26/2007   Qualifier: Diagnosis of  By: Nena Jordan     Past Surgical History:  Procedure Laterality Date   bone spur Left 11/07/2017   thumb   BUNIONECTOMY Left    COLONOSCOPY     DILATION AND CURETTAGE OF UTERUS  2002   uterine fibroids   EYE SURGERY Bilateral 2019   LEFT HEART CATH AND CORONARY ANGIOGRAPHY N/A 06/25/2017   Procedure: LEFT HEART CATH AND CORONARY ANGIOGRAPHY;   Surgeon: Marykay Lex, MD;  Location: [facility] INVASIVE CV LAB;  Service: Cardiovascular;  Laterality: N/A;   TONSILLECTOMY      Family History  Problem Relation Age of Onset   Lung cancer Father    Colon cancer Cousin        1st pat.cousin   Rectal cancer Neg Hx    Stomach cancer Neg Hx    Esophageal cancer Neg Hx    Pancreatic cancer Neg Hx     Social History   Socioeconomic History   Marital status: Married    Spouse name: Not on file   Number of children: 2   Years of education: Not on file   Highest education level: Not on file  Occupational History   Occupation: retired    Comment: Public librarian  Tobacco Use   Smoking status: Never   Smokeless tobacco: Never  Substance and Sexual Activity   Alcohol use: Yes    Comment: 4-5 glasses of wine per week   Drug use: No   Sexual activity: Never  Other Topics Concern   Not on file  Social History Narrative   Retired housewife, but worked previously as  telephone operator   Daughter is PA @ orthopedic office   Son is a Pharmacist, hospital         Social Determinants of Health   Financial Resource Strain: Low Risk    Difficulty of Paying Living Expenses: Not hard at all  Food Insecurity: No Food Insecurity   Worried About Charity fundraiser in the Last Year: Never true   Arboriculturist in the Last Year: Never true  Transportation Needs: No Transportation Needs   Lack of Transportation (Medical): No   Lack of Transportation (Non-Medical): No  Physical Activity: Sufficiently Active   Days of Exercise per Week: 7 days   Minutes of Exercise per Session: 40 min  Stress: No Stress Concern Present   Feeling of Stress : Only a little  Social Connections: Moderately Integrated   Frequency of Communication with Friends and Family: More than three times a week   Frequency of Social Gatherings with Friends and Family: More than three times a week   Attends Religious Services: More than 4 times per year   Active Member of [employer] or  Organizations: No   Attends Archivist Meetings: Never   Marital Status: Married  Human resources officer Violence: Not At Risk   Fear of Current or Ex-Partner: No   Emotionally Abused: No   Physically Abused: No   Sexually Abused: No    Outpatient Medications Prior to Visit  Medication Sig Dispense Refill   acetaminophen (TYLENOL) 325 MG tablet Take 650 mg by mouth every 6 (six) hours as needed for mild pain or moderate pain.     aspirin 81 MG tablet Take 81 mg by mouth daily.     Calcium-Vitamin D-Vitamin K 102-585-27 MG-UNT-MCG TABS Take 1 tablet by mouth daily.     fish oil-omega-3 fatty acids 1000 MG capsule Take 1 g by mouth daily.     atorvastatin (LIPITOR) 20 MG tablet Take 1 tablet (20 mg total) by mouth daily. 90 tablet 1   escitalopram (LEXAPRO) 10 MG tablet Take 1 tablet (10 mg total) by mouth daily. 90 tablet 1   omeprazole (PRILOSEC) 40 MG capsule Take 1 capsule (40 mg total) by mouth daily. 90 capsule 1   Facility-Administered Medications Prior to Visit  Medication Dose Route Frequency Provider Last Rate Last Admin   0.9 %  sodium chloride infusion  500 mL Intravenous Once Nelida Meuse III, MD        Allergies  Allergen Reactions   Codeine Nausea And Vomiting    REACTION: Jittery    ROS See HPI    Objective:    Physical Exam Constitutional:      General: She is not in acute distress.    Appearance: Normal appearance. She is well-developed.  HENT:     Head: Normocephalic and atraumatic.     Right Ear: External ear normal.     Left Ear: External ear normal.  Eyes:     General: No scleral icterus. Neck:     Thyroid: No thyromegaly.  Cardiovascular:     Rate and Rhythm: Normal rate and regular rhythm.     Heart sounds: Normal heart sounds. No murmur heard. Pulmonary:     Effort: Pulmonary effort is normal. No respiratory distress.     Breath sounds: Normal breath sounds. No wheezing.  Musculoskeletal:     Cervical back: Neck supple.  Skin:     General: Skin is warm and dry.  Neurological:     Mental Status:  She is alert and oriented to person, place, and time.  Psychiatric:        Mood and Affect: Mood normal.        Behavior: Behavior normal.        Thought Content: Thought content normal.        Judgment: Judgment normal.    BP (!) 133/53 (BP Location: Left Arm, Patient Position: Sitting, Cuff Size: Small)    Pulse 70    Temp 98.3 F (36.8 C) (Oral)    Resp 16    Wt 147 lb (66.7 kg)    SpO2 98%    BMI 26.04 kg/m  Wt Readings from Last 3 Encounters:  07/29/21 147 lb (66.7 kg)  01/25/21 145 lb (65.8 kg)  12/09/20 144 lb 12.8 oz (65.7 kg)       Assessment & Plan:   Problem List Items Addressed This Visit       Unprioritized   Hyperlipidemia with target low density lipoprotein (LDL) cholesterol less than 70 mg/dL - Primary (Chronic)    Tolerating statin. Obtain follow up lipid panel.       Relevant Medications   atorvastatin (LIPITOR) 20 MG tablet   Other Relevant Orders   Lipid panel   Comp Met (CMET)   GERD (gastroesophageal reflux disease)    Stable on omeprazole. Has not tolerated skipping doses due to recurrent symptoms.       Relevant Medications   omeprazole (PRILOSEC) 40 MG capsule   Anxiety and depression    Stable on lexapro $RemoveBe'10mg'MyOfyvUEN$ . Continue same.       Relevant Medications   escitalopram (LEXAPRO) 10 MG tablet    I am having Samantha Landry maintain her aspirin, fish oil-omega-3 fatty acids, Calcium-Vitamin D-Vitamin K, acetaminophen, escitalopram, atorvastatin, and omeprazole. We will continue to administer sodium chloride.  Meds ordered this encounter  Medications   escitalopram (LEXAPRO) 10 MG tablet    Sig: Take 1 tablet (10 mg total) by mouth daily.    Dispense:  90 tablet    Refill:  1    Order Specific Question:   Supervising Provider    Answer:   Penni Homans A [[id number]]   atorvastatin (LIPITOR) 20 MG tablet    Sig: Take 1 tablet (20 mg total) by mouth daily.    Dispense:  90 tablet     Refill:  1    Order Specific Question:   Supervising Provider    Answer:   Penni Homans A [[id number]]   omeprazole (PRILOSEC) 40 MG capsule    Sig: Take 1 capsule (40 mg total) by mouth daily.    Dispense:  90 capsule    Refill:  1    Order Specific Question:   Supervising Provider    Answer:   Penni Homans A [[id number]]

## 2021-08-09 DIAGNOSIS — M65342 Trigger finger, left ring finger: Secondary | ICD-10-CM | POA: Diagnosis not present

## 2021-11-17 DIAGNOSIS — Z1231 Encounter for screening mammogram for malignant neoplasm of breast: Secondary | ICD-10-CM | POA: Diagnosis not present

## 2021-11-17 LAB — HM MAMMOGRAPHY

## 2021-11-23 DIAGNOSIS — L814 Other melanin hyperpigmentation: Secondary | ICD-10-CM | POA: Diagnosis not present

## 2021-11-23 DIAGNOSIS — L821 Other seborrheic keratosis: Secondary | ICD-10-CM | POA: Diagnosis not present

## 2021-11-23 DIAGNOSIS — Z85820 Personal history of malignant melanoma of skin: Secondary | ICD-10-CM | POA: Diagnosis not present

## 2021-11-23 DIAGNOSIS — D2372 Other benign neoplasm of skin of left lower limb, including hip: Secondary | ICD-10-CM | POA: Diagnosis not present

## 2021-11-23 DIAGNOSIS — D492 Neoplasm of unspecified behavior of bone, soft tissue, and skin: Secondary | ICD-10-CM | POA: Diagnosis not present

## 2021-11-23 DIAGNOSIS — L578 Other skin changes due to chronic exposure to nonionizing radiation: Secondary | ICD-10-CM | POA: Diagnosis not present

## 2021-11-23 DIAGNOSIS — D2371 Other benign neoplasm of skin of right lower limb, including hip: Secondary | ICD-10-CM | POA: Diagnosis not present

## 2021-11-23 DIAGNOSIS — D2271 Melanocytic nevi of right lower limb, including hip: Secondary | ICD-10-CM | POA: Diagnosis not present

## 2021-12-08 DIAGNOSIS — Z961 Presence of intraocular lens: Secondary | ICD-10-CM | POA: Diagnosis not present

## 2021-12-14 ENCOUNTER — Ambulatory Visit: Payer: Medicare Other

## 2022-01-13 ENCOUNTER — Ambulatory Visit: Payer: Medicare Other

## 2022-01-20 ENCOUNTER — Ambulatory Visit: Payer: Medicare Other | Admitting: Family

## 2022-01-31 ENCOUNTER — Ambulatory Visit (INDEPENDENT_AMBULATORY_CARE_PROVIDER_SITE_OTHER): Payer: Medicare Other | Admitting: Family

## 2022-01-31 VITALS — BP 115/82 | HR 61 | Temp 98.0°F | Resp 16 | Wt 148.0 lb

## 2022-01-31 DIAGNOSIS — R197 Diarrhea, unspecified: Secondary | ICD-10-CM

## 2022-01-31 DIAGNOSIS — F419 Anxiety disorder, unspecified: Secondary | ICD-10-CM | POA: Diagnosis not present

## 2022-01-31 DIAGNOSIS — E782 Mixed hyperlipidemia: Secondary | ICD-10-CM

## 2022-01-31 DIAGNOSIS — F32A Depression, unspecified: Secondary | ICD-10-CM

## 2022-01-31 DIAGNOSIS — R194 Change in bowel habit: Secondary | ICD-10-CM | POA: Insufficient documentation

## 2022-01-31 DIAGNOSIS — K219 Gastro-esophageal reflux disease without esophagitis: Secondary | ICD-10-CM | POA: Diagnosis not present

## 2022-01-31 NOTE — Assessment & Plan Note (Signed)
Tolerating statin. Last lipid panel looked good.

## 2022-01-31 NOTE — Progress Notes (Signed)
Subjective:     Patient ID: Samantha Landry, female    DOB: 09-Apr-1946, 76 y.o.   MRN: [id number]  Chief Complaint  Patient presents with   Anxiety    Here for follow up   Depression    Follow up   Diarrhea    Patient reports having diarrhea on and off for about     Patient is in today for follow up.  Anxiety/depression- maintained on lexapro '10mg'$ .  She reports mood is good. Husband's dementia continues to progress. She seems to be handling it OK. She brings caregivers in 2 days a week so she can have a break from care taking and also schedule appointments.   Hyperlipidemia- maintained on atorvastatin daily.  Lab Results  Component Value Date   CHOL 166 07/29/2021   HDL 50.80 07/29/2021   LDLCALC 87 07/29/2021   LDLDIRECT 142.9 04/04/2007   TRIG 145.0 07/29/2021   CHOLHDL 3 07/29/2021   GERD- maintained on omeprazole '40mg'$ . She has tried to stop it in the past and the reflux comes back.  Went to ENT.  She notes that she has been having increased frequency of loose stools over the last several months. Sometimes she will have diarrhea.  Has 3-4 BM's per day.  Added a probiotic which helped a little. She has tried immodium as well which has helped briefly.    Health Maintenance Due  Topic Date Due   TETANUS/TDAP  10/25/2020   INFLUENZA VACCINE  01/03/2022    Past Medical History:  Diagnosis Date   Allergy    Alopecia 08/03/2011   Anxiety    Anxiety and depression 11/25/2015   Arthritis    Benign neoplasm of skin of trunk 11/14/2010   Cancer ([facility])    Cataract    Diverticulosis of colon    DIVERTICULOSIS, COLON 03/26/2007   Qualifier: Diagnosis of  By: Wynona Luna    GERD (gastroesophageal reflux disease)    Heart murmur    HOT FLASHES 03/25/2007   Qualifier: Diagnosis of  By: Wynona Luna    Hyperlipidemia    Melanoma in situ [facility]) 02/2007   right arm   Osteopenia 08/20/2012   PULMONARY NODULE 03/25/2007   Qualifier: Diagnosis of  By: Wynona Luna     Pulmonary nodules 02/2007   2-18m   SKIN CANCER, HX OF 03/26/2007   Qualifier: Diagnosis of  By: YWynona Luna    Past Surgical History:  Procedure Laterality Date   bone spur Left 11/07/2017   thumb   BUNIONECTOMY Left    COLONOSCOPY     DILATION AND CURETTAGE OF UTERUS  2002   uterine fibroids   EYE SURGERY Bilateral 2019   LEFT HEART CATH AND CORONARY ANGIOGRAPHY N/A 06/25/2017   Procedure: LEFT HEART CATH AND CORONARY ANGIOGRAPHY;  Surgeon: HLeonie Man MD;  [facility];  Service: Cardiovascular;  Laterality: N/A;   TONSILLECTOMY      Family History  Problem Relation Age of Onset   Lung cancer Father    Colon cancer Cousin        1st pat.cousin   Rectal cancer Neg Hx    Stomach cancer Neg Hx    Esophageal cancer Neg Hx    Pancreatic cancer Neg Hx     Social History   Socioeconomic History   Marital status: Married    Spouse name: Not on file   Number of children: 2  Years of education: Not on file   Highest education level: Not on file  Occupational History   Occupation: retired    Comment: Primary school teacher  Tobacco Use   Smoking status: Never   Smokeless tobacco: Never  Substance and Sexual Activity   Alcohol use: Yes    Comment: 4-5 glasses of wine per week   Drug use: No   Sexual activity: Never  Other Topics Concern   Not on file  Social History Narrative   Retired housewife, 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   Daughter is PA @ orthopedic office   Son is a Pharmacologist Strain: [place]  (12/09/2020)   Overall Financial Resource Strain (CARDIA)    Difficulty of Paying Living Expenses: Not hard at all  Food Insecurity: No [place] (12/09/2020)   Hunger Vital Sign    Worried About Running Out of Food in the Last Year: Never true    [place] in the Last Year: Never true  Transportation Needs: No Transportation Needs (12/09/2020)   PRAPARE -  Hydrologist (Medical): No    Lack of Transportation (Non-Medical): No  Physical Activity: Sufficiently Active (12/09/2020)   Exercise Vital Sign    Days of Exercise per Week: 7 days    Minutes of Exercise per Session: 40 min  Stress: No Stress Concern Present (12/09/2020)   [place]    Feeling of Stress : Only a little  Social Connections: Moderately Integrated (12/09/2020)   Social Connection and Isolation Panel [NHANES]    Frequency of Communication with Friends and Family: More than three times a week    Frequency of Social Gatherings with Friends and Family: More than three times a week    Attends Religious Services: More than 4 times per year    Active Member of [employer] or Organizations: No    Attends Archivist Meetings: Never    Marital Status: Married  Human resources officer Violence: Not At Risk (12/09/2020)   Humiliation, Afraid, Rape, and Kick questionnaire    Fear of Current or Ex-Partner: No    Emotionally Abused: No    Physically Abused: No    Sexually Abused: No    Outpatient Medications Prior to Visit  Medication Sig Dispense Refill   acetaminophen (TYLENOL) 325 MG tablet Take 650 mg by mouth every 6 (six) hours as needed for mild pain or moderate pain.     aspirin 81 MG tablet Take 81 mg by mouth daily.     atorvastatin (LIPITOR) 20 MG tablet Take 1 tablet (20 mg total) by mouth daily. 90 tablet 1   Calcium-Vitamin D-Vitamin K 818-563-14 MG-UNT-MCG TABS Take 1 tablet by mouth daily.     escitalopram (LEXAPRO) 10 MG tablet Take 1 tablet (10 mg total) by mouth daily. 90 tablet 1   fish oil-omega-3 fatty acids 1000 MG capsule Take 1 g by mouth daily.     omeprazole (PRILOSEC) 40 MG capsule Take 1 capsule (40 mg total) by mouth daily. 90 capsule 1   Facility-Administered Medications Prior to Visit  Medication Dose Route Frequency Provider Last Rate Last Admin   0.9 %  sodium  chloride infusion  500 mL Intravenous Once Doran Stabler, MD        Allergies  Allergen Reactions   Codeine Nausea And Vomiting  REACTION: Jittery    Review of Systems  Gastrointestinal:  Positive for diarrhea.       Objective:    Physical Exam Constitutional:      General: She is not in acute distress.    Appearance: Normal appearance. She is well-developed.  HENT:     Head: Normocephalic and atraumatic.     Right Ear: External ear normal.     Left Ear: External ear normal.  Eyes:     General: No scleral icterus. Neck:     Thyroid: No thyromegaly.  Cardiovascular:     Rate and Rhythm: Normal rate and regular rhythm.     Heart sounds: Normal heart sounds. No murmur heard. Pulmonary:     Effort: Pulmonary effort is normal. No respiratory distress.     Breath sounds: Normal breath sounds. No wheezing.  Musculoskeletal:     Cervical back: Neck supple.  Skin:    General: Skin is warm and dry.  Neurological:     Mental Status: She is alert and oriented to person, place, and time.  Psychiatric:        Mood and Affect: Mood normal.        Behavior: Behavior normal.        Thought Content: Thought content normal.        Judgment: Judgment normal.     BP 115/82 (BP Location: Right Arm, Patient Position: Sitting, Cuff Size: Small)   Pulse 61   Temp 98 F (36.7 C) (Oral)   Resp 16   Wt 148 lb (67.1 kg)   SpO2 99%   BMI 26.22 kg/m  Wt Readings from Last 3 Encounters:  01/31/22 148 lb (67.1 kg)  07/29/21 147 lb (66.7 kg)  01/25/21 145 lb (65.8 kg)       Assessment & Plan:   Problem List Items Addressed This Visit       Unprioritized   Hyperlipidemia    Tolerating statin. Last lipid panel looked good.       GERD (gastroesophageal reflux disease)    Stable on omeprazole '40mg'$  once daily. Continue same.       Diarrhea - Primary    New. Will check stool studies. If stool studies are normal and diarrhea persists, will plan referral to GI.        Relevant Orders   Stool Culture   Clostridium Difficile by PCR([facility])   Anxiety and depression    Stable on omeprazole. Continue same.        I am having Samantha Landry maintain her aspirin, fish oil-omega-3 fatty acids, Calcium-Vitamin D-Vitamin K, acetaminophen, escitalopram, atorvastatin, and omeprazole. We will continue to administer sodium chloride.  No orders of the defined types were placed in this encounter.

## 2022-01-31 NOTE — Assessment & Plan Note (Signed)
Stable on omeprazole. Continue same.  ?

## 2022-01-31 NOTE — Assessment & Plan Note (Signed)
New. Will check stool studies. If stool studies are normal and diarrhea persists, will plan referral to GI.

## 2022-01-31 NOTE — Assessment & Plan Note (Signed)
Stable on omeprazole '40mg'$  once daily. Continue same.

## 2022-02-09 ENCOUNTER — Other Ambulatory Visit: Payer: Medicare Other

## 2022-02-09 DIAGNOSIS — R197 Diarrhea, unspecified: Secondary | ICD-10-CM

## 2022-02-10 ENCOUNTER — Ambulatory Visit (INDEPENDENT_AMBULATORY_CARE_PROVIDER_SITE_OTHER): Payer: Medicare Other | Admitting: *Deleted

## 2022-02-10 VITALS — BP 147/82 | HR 71 | Ht 63.0 in | Wt 148.2 lb

## 2022-02-10 DIAGNOSIS — Z0000 Encounter for general adult medical examination without abnormal findings: Secondary | ICD-10-CM | POA: Diagnosis not present

## 2022-02-10 NOTE — Progress Notes (Signed)
Subjective:   Samantha Landry is a 76 y.o. female who presents for Medicare Annual (Subsequent) preventive examination.  Review of Systems    Defer to PCP Cardiac Risk Factors include: advanced age (>12mn, >>52women);dyslipidemia     Objective:    Today's Vitals   02/10/22 0856  BP: (!) 147/82  Pulse: 71  Weight: 148 lb 3.2 oz (67.2 kg)  Height: '5\' 3"'$  (1.6 m)   Body mass index is 26.25 kg/m.     02/10/2022    9:00 AM 12/09/2020    9:35 AM 12/03/2019    8:51 AM 11/29/2018    2:41 PM 11/26/2017    3:01 PM 06/25/2017    8:34 AM 11/01/2016    8:33 AM  Advanced Directives  Does Patient Have a Medical Advance Directive? Yes Yes Yes Yes Yes Yes Yes  Type of AParamedicof [place]Living will [place]Living will HN[place]Living will HLake ProvidenceLiving will [place]Indian CreekLiving will  [place]Living will  Does patient want to make changes to medical advance directive?   No - Patient declined No - Patient declined  No - Patient declined   Copy of HLodiin Chart? No - copy requested No - copy requested No - copy requested No - copy requested No - copy requested  No - copy requested    Current Medications (verified) Outpatient Encounter Medications as of 02/10/2022  Medication Sig   acetaminophen (TYLENOL) 325 MG tablet Take 650 mg by mouth every 6 (six) hours as needed for mild pain or moderate pain.   aspirin 81 MG tablet Take 81 mg by mouth daily.   atorvastatin (LIPITOR) 20 MG tablet Take 1 tablet (20 mg total) by mouth daily.   Calcium-Vitamin D-Vitamin K 7638-756-43MG-UNT-MCG TABS Take 1 tablet by mouth daily.   escitalopram (LEXAPRO) 10 MG tablet Take 1 tablet (10 mg total) by mouth daily.   fish oil-omega-3 fatty acids 1000 MG capsule Take 1 g by mouth daily.   omeprazole (PRILOSEC) 40 MG capsule Take 1 capsule (40 mg total) by mouth daily.    Facility-Administered Encounter Medications as of 02/10/2022  Medication   0.9 %  sodium chloride infusion    Allergies (verified) Codeine   History: Past Medical History:  Diagnosis Date   Allergy    Alopecia 08/03/2011   Anxiety    Anxiety and depression 11/25/2015   Arthritis    Benign neoplasm of skin of trunk 11/14/2010   Cancer ([facility]    Cataract    Diverticulosis of colon    DIVERTICULOSIS, COLON 03/26/2007   Qualifier: Diagnosis of  By: YWynona Luna   GERD (gastroesophageal reflux disease)    Heart murmur    HOT FLASHES 03/25/2007   Qualifier: Diagnosis of  By: YWynona Luna   Hyperlipidemia    Melanoma in situ ([facility] 02/2007   right arm   Osteopenia 08/20/2012   PULMONARY NODULE 03/25/2007   Qualifier: Diagnosis of  By: YWynona Luna   Pulmonary nodules 02/2007   2-339m  SKIN CANCER, HX OF 03/26/2007   Qualifier: Diagnosis of  By: YoWynona Luna  Past Surgical History:  Procedure Laterality Date   bone spur Left 11/07/2017   thumb   BUNIONECTOMY Left    COLONOSCOPY     DILATION AND CURETTAGE OF UTERUS  2002   uterine  fibroids   EYE SURGERY Bilateral 2019   LEFT HEART CATH AND CORONARY ANGIOGRAPHY N/A 06/25/2017   Procedure: LEFT HEART CATH AND CORONARY ANGIOGRAPHY;  Surgeon: Leonie Man, MD;  Location: [facility];  Service: Cardiovascular;  Laterality: N/A;   TONSILLECTOMY     Family History  Problem Relation Age of Onset   Lung cancer Father    Colon cancer Cousin        1st pat.cousin   Rectal cancer Neg Hx    Stomach cancer Neg Hx    Esophageal cancer Neg Hx    Pancreatic cancer Neg Hx    Social History   Socioeconomic History   Marital status: Married    Spouse name: Not on file   Number of children: 2   Years of education: Not on file   Highest education level: Not on file  Occupational History   Occupation: retired    Comment: Primary school teacher  Tobacco Use   Smoking status: Never   Smokeless tobacco:  Never  Substance and Sexual Activity   Alcohol use: Yes    Comment: 4-5 glasses of wine per week   Drug use: No   Sexual activity: Never  Other Topics Concern   Not on file  Social History Narrative   Retired housewife, 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   Daughter is PA @ orthopedic office   Son is a Pharmacologist Strain: [place]  (12/09/2020)   Overall Financial Resource Strain (CARDIA)    Difficulty of Paying Living Expenses: Not hard at all  Food Insecurity: No [place] (12/09/2020)   Hunger Vital Sign    Worried About Running Out of Food in the Last Year: Never true    [place] in the Last Year: Never true  Transportation Needs: No Transportation Needs (12/09/2020)   PRAPARE - Hydrologist (Medical): No    Lack of Transportation (Non-Medical): No  Physical Activity: Sufficiently Active (12/09/2020)   Exercise Vital Sign    Days of Exercise per Week: 7 days    Minutes of Exercise per Session: 40 min  Stress: No Stress Concern Present (12/09/2020)   [place]    Feeling of Stress : Only a little  Social Connections: Moderately Integrated (12/09/2020)   Social Connection and Isolation Panel [NHANES]    Frequency of Communication with Friends and Family: More than three times a week    Frequency of Social Gatherings with Friends and Family: More than three times a week    Attends Religious Services: More than 4 times per year    Active Member of [employer] or Organizations: No    Attends Music therapist: Never    Marital Status: Married    Tobacco Counseling Counseling given: Not Answered   Clinical Intake:  Pre-visit preparation completed: Yes  Pain : No/denies pain     Diabetes: No  How often do you need to have someone help you when you read instructions, pamphlets, or other written  materials from your doctor or pharmacy?: 1 - Never  Diabetic? No    Activities of Daily Living    02/10/2022    9:01 AM  In your present state of health, do you have any difficulty performing the following activities:  Hearing? 1  Comment slight hearing loss  Vision? 0  Difficulty concentrating or making decisions? 0  Walking or climbing stairs? 0  Dressing or bathing? 0  Doing errands, shopping? 0  Preparing Food and eating ? N  Using the Toilet? N  In the past six months, have you accidently leaked urine? N  Do you have problems with loss of bowel control? N  Managing your Medications? N  Managing your Finances? N  Housekeeping or managing your Housekeeping? N    Patient Care Team: Debbrah Alar, NP as PCP - General (Internal Medicine) Carren Rang, Nadara Mustard, MD as Consulting Physician (Gynecology)  Indicate any recent Medical Services you may have received from other than [facility] providers in the past year (date may be approximate).     Assessment:   This is a routine wellness examination for [place].  Hearing/Vision screen No results found.  Dietary issues and exercise activities discussed: Current Exercise Habits: Home exercise routine, Type of exercise: walking, Time (Minutes): 40, Frequency (Times/Week): 7, Weekly Exercise (Minutes/Week): 280, Intensity: Mild, Exercise limited by: None identified   Goals Addressed   None    Depression Screen    02/10/2022    9:00 AM 01/31/2022    9:36 AM 07/29/2021    9:16 AM 12/09/2020    9:36 AM 06/24/2020    9:29 AM 12/03/2019    8:55 AM 06/27/2019    9:30 AM  PHQ 2/9 Scores  PHQ - 2 Score 0 1 1 0 0 0 0  PHQ- 9 Score  '3 3  3  '$ 0    Fall Risk    02/10/2022    9:00 AM 01/31/2022    9:20 AM 12/09/2020    9:36 AM 12/03/2019    8:55 AM 06/27/2019    9:30 AM  Fall Risk   Falls in the past year? 0 0 1 0 0  Number falls in past yr: 0 0 1 0 0  Injury with Fall? 0 0 1 0 0  Risk for fall due to : No Fall Risks      Follow up Falls  evaluation completed  Falls prevention discussed Education provided;Falls prevention discussed     FALL RISK PREVENTION PERTAINING TO THE HOME:  Any stairs in or around the home? Yes  If so, are there any without handrails? No  Home free of loose throw rugs in walkways, pet beds, electrical cords, etc? Yes  Adequate lighting in your home to reduce risk of falls? Yes   ASSISTIVE DEVICES UTILIZED TO PREVENT FALLS:  Life alert? No  Use of a cane, walker or w/c? No  Grab bars in the bathroom? Yes  Shower chair or bench in shower? Yes  Elevated toilet seat or a handicapped toilet? Yes   TIMED UP AND GO:  Was the test performed? Yes .  Length of time to ambulate 10 feet: 5 sec.   Gait steady and fast without use of assistive device  Cognitive Function:        02/10/2022    9:06 AM  6CIT Screen  What Year? 0 points  What month? 0 points  What time? 0 points  Count back from 20 2 points  Months in reverse 0 points  Repeat phrase 2 points  Total Score 4 points    Immunizations Immunization History  Administered Date(s) Administered   Influenza Split 03/05/2012   Influenza Whole 03/22/2010   Influenza, High Dose Seasonal PF 02/23/2015, 02/25/2016, 02/14/2017   Influenza-Unspecified 02/12/2017, 03/07/2018   Moderna Sars-Covid-2 Vaccination 07/17/2019, 08/25/2019, 04/19/2020   Pneumococcal Conjugate-13 09/22/2013  Pneumococcal Polysaccharide-23 03/05/2012   Tdap 10/26/2010   Zoster Recombinat (Shingrix) 05/02/2017, 07/02/2017   Zoster, Live 10/26/2010    TDAP status: Due, Education has been provided regarding the importance of this vaccine. Advised may receive this vaccine at local pharmacy or Health Dept. Aware to provide a copy of the vaccination record if obtained from local pharmacy or Health Dept. Verbalized acceptance and understanding.  Flu Vaccine status: Due, Education has been provided regarding the importance of this vaccine. Advised may receive this vaccine at  local pharmacy or Health Dept. Aware to provide a copy of the vaccination record if obtained from local pharmacy or Health Dept. Verbalized acceptance and understanding.  Pneumococcal vaccine status: Up to date  Covid-19 vaccine status: Declined, Education has been provided regarding the importance of this vaccine but patient still declined. Advised may receive this vaccine at local pharmacy or Health Dept.or vaccine clinic. Aware to provide a copy of the vaccination record if obtained from local pharmacy or Health Dept. Verbalized acceptance and understanding.  Qualifies for Shingles Vaccine? Yes   Zostavax completed Yes   Shingrix Completed?: Yes  Screening Tests Health Maintenance  Topic Date Due   TETANUS/TDAP  10/25/2020   INFLUENZA VACCINE  01/03/2022   Pneumonia Vaccine 79+ Years old  Completed   DEXA SCAN  Completed   Hepatitis C Screening  Completed   Zoster Vaccines- Shingrix  Completed   HPV VACCINES  Aged Out   COVID-19 Vaccine  Discontinued    Health Maintenance  Health Maintenance Due  Topic Date Due   TETANUS/TDAP  10/25/2020   INFLUENZA VACCINE  01/03/2022    Colorectal cancer screening: Type of screening: Colonoscopy. Completed 2020. Repeat every N/a years  Mammogram status: Completed 11/17/21. Repeat every year  Bone Density status: Completed 12/27/20. Results reflect: Bone density results: OSTEOPENIA. Repeat every 2 years.  Lung Cancer Screening: (Low Dose CT Chest recommended if Age 7-80 years, 30 pack-year currently smoking OR have quit w/in 15years.) does not qualify.   Lung Cancer Screening Referral: N/a  Additional Screening:  Hepatitis C Screening: does qualify; Completed 12/19/17  Vision Screening: Recommended annual ophthalmology exams for early detection of glaucoma and other disorders of the eye. Is the patient up to date with their annual eye exam?  Yes  Who is the provider or what is the name of the office in which the patient attends annual  eye exams? Dr. Gershon Crane If pt is not established with a provider, would they like to be referred to a provider to establish care? No .   Dental Screening: Recommended annual dental exams for proper oral hygiene  Community Resource Referral / Chronic Care Management: CRR required this visit?  No   CCM required this visit?  No      Plan:     I have personally reviewed and noted the following in the patient's chart:   Medical and social history Use of alcohol, tobacco or illicit drugs  Current medications and supplements including opioid prescriptions. Patient is not currently taking opioid prescriptions. Functional ability and status Nutritional status Physical activity Advanced directives List of other physicians Hospitalizations, surgeries, and ER visits in previous 12 months Vitals Screenings to include cognitive, depression, and falls Referrals and appointments  In addition, I have reviewed and discussed with patient certain preventive protocols, quality metrics, and best practice recommendations. A written personalized care plan for preventive services as well as general preventive health recommendations were provided to patient.     Beatris Ship, [place]  02/10/2022   Nurse Notes: None

## 2022-02-10 NOTE — Patient Instructions (Signed)
Samantha Landry , Thank you for taking time to come for your Medicare Wellness Visit. I appreciate your ongoing commitment to your health goals. Please review the following plan we discussed and let me know if I can assist you in the future.   These are the goals we discussed:  Goals       Continue exercising (pt-stated)      Maintain current healthy lifestyle.        This is a list of the screening recommended for you and due dates:  Health Maintenance  Topic Date Due   Tetanus Vaccine  10/25/2020   Flu Shot  01/03/2022   Pneumonia Vaccine  Completed   DEXA scan (bone density measurement)  Completed   Hepatitis C Screening: USPSTF Recommendation to screen - Ages 48-79 yo.  Completed   Zoster (Shingles) Vaccine  Completed   HPV Vaccine  Aged Out   COVID-19 Vaccine  Discontinued       Next appointment: Follow up in one year for your annual wellness visit    Preventive Care 65 Years and Older, Female Preventive care refers to lifestyle choices and visits with your health care provider that can promote health and wellness. What does preventive care include? A yearly physical exam. This is also called an annual well check. Dental exams once or twice a year. Routine eye exams. Ask your health care provider how often you should have your eyes checked. Personal lifestyle choices, including: Daily care of your teeth and gums. Regular physical activity. Eating a healthy diet. Avoiding tobacco and drug use. Limiting alcohol use. Practicing safe sex. Taking low-dose aspirin every day. Taking vitamin and mineral supplements as recommended by your health care provider. What happens during an annual well check? The services and screenings done by your health care provider during your annual well check will depend on your age, overall health, lifestyle risk factors, and family history of disease. Counseling  Your health care provider may ask you questions about your: Alcohol use. Tobacco  use. Drug use. Emotional well-being. Home and relationship well-being. Sexual activity. Eating habits. History of falls. Memory and ability to understand (cognition). Work and work Statistician. Reproductive health. Screening  You may have the following tests or measurements: Height, weight, and BMI. Blood pressure. Lipid and cholesterol levels. These may be checked every 5 years, or more frequently if you are over 48 years old. Skin check. Lung cancer screening. You may have this screening every year starting at age 19 if you have a 30-pack-year history of smoking and currently smoke or have quit within the past 15 years. Fecal occult blood test (FOBT) of the stool. You may have this test every year starting at age 31. Flexible sigmoidoscopy or colonoscopy. You may have a sigmoidoscopy every 5 years or a colonoscopy every 10 years starting at age 53. Hepatitis C blood test. Hepatitis B blood test. Sexually transmitted disease (STD) testing. Diabetes screening. This is done by checking your blood sugar (glucose) after you have not eaten for a while (fasting). You may have this done every 1-3 years. Bone density scan. This is done to screen for osteoporosis. You may have this done starting at age 59. Mammogram. This may be done every 1-2 years. Talk to your health care provider about how often you should have regular mammograms. Talk with your health care provider about your test results, treatment options, and if necessary, the need for more tests. Vaccines  Your health care provider may recommend certain vaccines, such  as: Influenza vaccine. This is recommended every year. Tetanus, diphtheria, and acellular pertussis (Tdap, Td) vaccine. You may need a Td booster every 10 years. Zoster vaccine. You may need this after age 22. Pneumococcal 13-valent conjugate (PCV13) vaccine. One dose is recommended after age 82. Pneumococcal polysaccharide (PPSV23) vaccine. One dose is recommended  after age 38. Talk to your health care provider about which screenings and vaccines you need and how often you need them. This information is not intended to replace advice given to you by your health care provider. Make sure you discuss any questions you have with your health care provider. Document Released: 06/18/2015 Document Revised: 02/09/2016 Document Reviewed: 03/23/2015 Elsevier Interactive Patient Education  2017 [employer] Prevention in the Home Falls can cause injuries. They can happen to people of all ages. There are many things you can do to make your home safe and to help prevent falls. What can I do on the outside of my home? Regularly fix the edges of walkways and driveways and fix any cracks. Remove anything that might make you trip as you walk through a door, such as a raised step or threshold. Trim any bushes or trees on the path to your home. Use bright outdoor lighting. Clear any walking paths of anything that might make someone trip, such as rocks or tools. Regularly check to see if handrails are loose or broken. Make sure that both sides of any steps have handrails. Any raised decks and porches should have guardrails on the edges. Have any leaves, snow, or ice cleared regularly. Use sand or salt on walking paths during winter. Clean up any spills in your garage right away. This includes oil or grease spills. What can I do in the bathroom? Use night lights. Install grab bars by the toilet and in the tub and shower. Do not use towel bars as grab bars. Use non-skid mats or decals in the tub or shower. If you need to sit down in the shower, use a plastic, non-slip stool. Keep the floor dry. Clean up any water that spills on the floor as soon as it happens. Remove soap buildup in the tub or shower regularly. Attach bath mats securely with double-sided non-slip rug tape. Do not have throw rugs and other things on the floor that can make you trip. What can I do  in the bedroom? Use night lights. Make sure that you have a light by your bed that is easy to reach. Do not use any sheets or blankets that are too big for your bed. They should not hang down onto the floor. Have a firm chair that has side arms. You can use this for support while you get dressed. Do not have throw rugs and other things on the floor that can make you trip. What can I do in the kitchen? Clean up any spills right away. Avoid walking on wet floors. Keep items that you use a lot in easy-to-reach places. If you need to reach something above you, use a strong step stool that has a grab bar. Keep electrical cords out of the way. Do not use floor polish or wax that makes floors slippery. If you must use wax, use non-skid floor wax. Do not have throw rugs and other things on the floor that can make you trip. What can I do with my stairs? Do not leave any items on the stairs. Make sure that there are handrails on both sides of the stairs and use  them. Fix handrails that are broken or loose. Make sure that handrails are as long as the stairways. Check any carpeting to make sure that it is firmly attached to the stairs. Fix any carpet that is loose or worn. Avoid having throw rugs at the top or bottom of the stairs. If you do have throw rugs, attach them to the floor with carpet tape. Make sure that you have a light switch at the top of the stairs and the bottom of the stairs. If you do not have them, ask someone to add them for you. What else can I do to help prevent falls? Wear shoes that: Do not have high heels. Have rubber bottoms. Are comfortable and fit you well. Are closed at the toe. Do not wear sandals. If you use a stepladder: Make sure that it is fully opened. Do not climb a closed stepladder. Make sure that both sides of the stepladder are locked into place. Ask someone to hold it for you, if possible. Clearly mark and make sure that you can see: Any grab bars or  handrails. First and last steps. Where the edge of each step is. Use tools that help you move around (mobility aids) if they are needed. These include: Canes. Walkers. Scooters. Crutches. Turn on the lights when you go into a dark area. Replace any light bulbs as soon as they burn out. Set up your furniture so you have a clear path. Avoid moving your furniture around. If any of your floors are uneven, fix them. If there are any pets around you, be aware of where they are. Review your medicines with your doctor. Some medicines can make you feel dizzy. This can increase your chance of falling. Ask your doctor what other things that you can do to help prevent falls. This information is not intended to replace advice given to you by your health care provider. Make sure you discuss any questions you have with your health care provider. Document Released: 03/18/2009 Document Revised: 10/28/2015 Document Reviewed: 06/26/2014 Elsevier Interactive Patient Education  2017 [employer].

## 2022-02-11 LAB — CLOSTRIDIUM DIFFICILE BY PCR: Toxigenic C. Difficile by PCR: NEGATIVE

## 2022-02-13 ENCOUNTER — Encounter: Payer: Self-pay | Admitting: Family

## 2022-02-13 ENCOUNTER — Encounter: Payer: Self-pay | Admitting: Gastroenterology

## 2022-02-13 DIAGNOSIS — R197 Diarrhea, unspecified: Secondary | ICD-10-CM

## 2022-02-13 LAB — STOOL CULTURE: E coli, Shiga toxin Assay: NEGATIVE

## 2022-03-09 DIAGNOSIS — M542 Cervicalgia: Secondary | ICD-10-CM | POA: Diagnosis not present

## 2022-03-13 DIAGNOSIS — Z23 Encounter for immunization: Secondary | ICD-10-CM | POA: Diagnosis not present

## 2022-03-16 ENCOUNTER — Ambulatory Visit (INDEPENDENT_AMBULATORY_CARE_PROVIDER_SITE_OTHER)
Admission: RE | Admit: 2022-03-16 | Discharge: 2022-03-16 | Disposition: A | Discharge status: Home / Self Care | Payer: Medicare Other | Source: Ambulatory Visit | Attending: Gastroenterology | Admitting: Gastroenterology

## 2022-03-16 ENCOUNTER — Other Ambulatory Visit (INDEPENDENT_AMBULATORY_CARE_PROVIDER_SITE_OTHER): Payer: Medicare Other

## 2022-03-16 ENCOUNTER — Encounter: Payer: Self-pay | Admitting: Gastroenterology

## 2022-03-16 ENCOUNTER — Ambulatory Visit (INDEPENDENT_AMBULATORY_CARE_PROVIDER_SITE_OTHER): Payer: Medicare Other | Admitting: Gastroenterology

## 2022-03-16 VITALS — BP 110/60 | HR 78 | Ht 63.0 in | Wt 147.0 lb

## 2022-03-16 DIAGNOSIS — R197 Diarrhea, unspecified: Secondary | ICD-10-CM

## 2022-03-16 DIAGNOSIS — R194 Change in bowel habit: Secondary | ICD-10-CM

## 2022-03-16 DIAGNOSIS — R140 Abdominal distension (gaseous): Secondary | ICD-10-CM | POA: Diagnosis not present

## 2022-03-16 LAB — TSH: TSH: 0.44 u[IU]/mL (ref 0.35–5.50)

## 2022-03-16 NOTE — Patient Instructions (Signed)
Your provider has requested that you have an abdominal x ray before leaving today. Please go to the basement floor to our Radiology department for the test.  Your provider has requested that you go to the basement level for lab work before leaving today. Press "B" on the elevator. The lab is located at the first door on the left as you exit the elevator.  _______________________________________________________  If you are age 75 or older, your body mass index should be between 23-30. Your Body mass index is 26.04 kg/m. If this is out of the aforementioned range listed, please consider follow up with your Primary Care Provider.  If you are age 59 or younger, your body mass index should be between 19-25. Your Body mass index is 26.04 kg/m. If this is out of the aformentioned range listed, please consider follow up with your Primary Care Provider.   ________________________________________________________  The [facility] providers would like to encourage you to use [facility] to communicate with providers for non-urgent requests or questions.  Due to long hold times on the telephone, sending your provider a message by [facility] may be a faster and more efficient way to get a response.  Please allow 48 business hours for a response.  Please remember that this is for non-urgent requests.  _______________________________________________________

## 2022-03-16 NOTE — Progress Notes (Signed)
03/16/2022 ELIABETH SHOFF [id number] [birth year]   HISTORY OF PRESENT ILLNESS: This is a pleasant 76 year old female who is a patient of Dr. Corena Pilgrim.  She says that she has always had stomach problems and was diagnosed with "spastic colon" several years ago.  She says that historically she has had issues with constipation.  She says that the end of June/early July she started having diarrhea all of a sudden.  Says that she had been eating more fruits and vegetables so she thought maybe it was that, but then it was persistent.  She saw her PCP and they performed stool for C. difficile and culture which were negative, that was in September.  She says that she was having up to 6 bowel movements a day with a lot of urgency and some nocturnal stools on occasion as well.  She tried changing her diet, cutting other things out to see if any of that would help, but nothing was consistent.  She did use Imodium with relief for short periods of time.  Denies abdominal pain or rectal bleeding.  She says that so for yesterday and today things have been improved with only about 1 bowel movement each day.  Colonoscopy 06/2017 with only internal hemorrhoids.   Past Medical History:  Diagnosis Date   Allergy    Alopecia 08/03/2011   Anxiety    Anxiety and depression 11/25/2015   Arthritis    Benign neoplasm of skin of trunk 11/14/2010   Cancer ([place])    Cataract    Colon polyp    Diverticulosis of colon    DIVERTICULOSIS, COLON 03/26/2007   Qualifier: Diagnosis of  By: Wynona Luna    GERD (gastroesophageal reflux disease)    Heart murmur    HOT FLASHES 03/25/2007   Qualifier: Diagnosis of  By: Wynona Luna    Hyperlipidemia    Melanoma in situ [facility]) 02/2007   right arm   Osteopenia 08/20/2012   PULMONARY NODULE 03/25/2007   Qualifier: Diagnosis of  By: Wynona Luna    Pulmonary nodules 02/2007   2-58m   SKIN CANCER, HX OF 03/26/2007   Qualifier: Diagnosis of  By: YWynona Luna   Past Surgical History:  Procedure Laterality Date   bone spur Left 11/07/2017   thumb   BUNIONECTOMY Left    COLONOSCOPY     DILATION AND CURETTAGE OF UTERUS  2002   uterine fibroids   EYE SURGERY Bilateral 2019   LEFT HEART CATH AND CORONARY ANGIOGRAPHY N/A 06/25/2017   Procedure: LEFT HEART CATH AND CORONARY ANGIOGRAPHY;  Surgeon: HLeonie Man MD;  [facility];  Service: Cardiovascular;  Laterality: N/A;   TONSILLECTOMY      reports that she has never smoked. She has never used smokeless tobacco. She reports current alcohol use. She reports that she does not use drugs. family history includes Colon cancer in her cousin; Lung cancer in her father. Allergies  Allergen Reactions   Codeine Nausea And Vomiting    REACTION: Jittery      Outpatient Encounter Medications as of 03/16/2022  Medication Sig   acetaminophen (TYLENOL) 325 MG tablet Take 650 mg by mouth every 6 (six) hours as needed for mild pain or moderate pain.   aspirin 81 MG tablet Take 81 mg by mouth daily.   atorvastatin (LIPITOR) 20 MG tablet Take 1 tablet (20 mg total) by mouth daily.   Calcium-Vitamin D-Vitamin K  150-569-79 MG-UNT-MCG TABS Take 1 tablet by mouth daily.   escitalopram (LEXAPRO) 10 MG tablet Take 1 tablet (10 mg total) by mouth daily.   fish oil-omega-3 fatty acids 1000 MG capsule Take 1 g by mouth daily.   gabapentin (NEURONTIN) 100 MG capsule Take 100 mg by mouth at bedtime.   methocarbamol (ROBAXIN) 500 MG tablet Take 500 mg by mouth in the morning and at bedtime.   omeprazole (PRILOSEC) 40 MG capsule Take 1 capsule (40 mg total) by mouth daily.   Facility-Administered Encounter Medications as of 03/16/2022  Medication   0.9 %  sodium chloride infusion     REVIEW OF SYSTEMS  : All other systems reviewed and negative except where noted in the History of Present Illness.   PHYSICAL EXAM: BP 110/60   Pulse 78   Ht '5\' 3"'$  (1.6 m)   Wt 147 lb (66.7 kg)   SpO2  98%   BMI 26.04 kg/m  General: Well developed white female in no acute distress Head: Normocephalic and atraumatic Eyes:  Sclerae anicteric, conjunctiva pink. Ears: Normal auditory acuity Lungs: Clear throughout to auscultation; no W/R/R. Heart: Regular rate and rhythm; no M/R/G. Abdomen: Soft, non-distended.  BS present.  Non-tender. Musculoskeletal: Symmetrical with no gross deformities  Skin: No lesions on visible extremities Extremities: No edema  Neurological: Alert oriented x 4, grossly non-focal Psychological:  Alert and cooperative. Normal mood and affect  ASSESSMENT AND PLAN: *76 year old female who historically has had constipation, but has been struggling with diarrhea since end of June/early July.  Stool studies were negative.  Suspect that she could have had something infectious and this might be some persistent postinfectious IBS or I wonder if she had been under treating her constipation and now having more of an overflow issue.  She did try a probiotic which did not help.  Things have been a little bit better over the past couple days.  We will check a TSH as it has not been checked in several years.  We will check an abdominal x-ray to check for stool burden.  If this is a postinfectious thing, may continue to get better with time.  Could consider course of Xifaxan if symptoms persist.   CC:  Samantha Alar, NP

## 2022-03-20 NOTE — Progress Notes (Signed)
____________________________________________________________  Attending physician addendum:  Thank you for sending this case to me. I have reviewed the entire note and agree with the plan.  It sounds like she has only been better for a few days.  If we hear from her that this diarrhea has recurred in the same manner as what has been occurring over the last few months, then she needs to be scheduled for colonoscopy with me to rule out microscopic colitis.  Wilfrid Lund, MD  ____________________________________________________________

## 2022-03-21 ENCOUNTER — Telehealth: Payer: Self-pay

## 2022-03-21 NOTE — Telephone Encounter (Signed)
Inbound call from patient returning call. Patient can be reached at [phone]. Please advise.  Thank you

## 2022-03-21 NOTE — Telephone Encounter (Signed)
Left message on machine to call back  

## 2022-03-21 NOTE — Telephone Encounter (Signed)
-----   Message from Loralie Champagne, PA-C sent at 03/21/2022  2:05 PM EDT ----- Can you get an update from her to see how she is doing?  ----- Message ----- From: Doran Stabler, MD Sent: 03/20/2022   1:35 PM EDT To: Loralie Champagne, PA-C     ----- Message ----- From: Loralie Champagne, PA-C Sent: 03/16/2022  12:46 PM EDT To: Doran Stabler, MD

## 2022-03-22 NOTE — Telephone Encounter (Signed)
The pt returned call and states she is doing very well.  She did bowel purge and had great response. No diarrhea currently.  She will call if she has any further concerns.

## 2022-03-22 NOTE — Telephone Encounter (Signed)
Left message on machine to call back  

## 2022-04-06 ENCOUNTER — Other Ambulatory Visit: Payer: Self-pay | Admitting: *Deleted

## 2022-04-06 MED ORDER — ATORVASTATIN CALCIUM 20 MG PO TABS: 20.0000 mg | ORAL_TABLET | Freq: Every day | ORAL | 1 refills | Status: DC

## 2022-04-06 MED ORDER — ESCITALOPRAM OXALATE 10 MG PO TABS: 10.0000 mg | ORAL_TABLET | Freq: Every day | ORAL | 1 refills | Status: DC

## 2022-08-04 ENCOUNTER — Ambulatory Visit: Payer: Medicare Other | Admitting: Family

## 2022-08-15 ENCOUNTER — Other Ambulatory Visit: Payer: Self-pay | Admitting: Family

## 2022-08-15 ENCOUNTER — Ambulatory Visit (INDEPENDENT_AMBULATORY_CARE_PROVIDER_SITE_OTHER): Payer: Medicare Other | Admitting: Family

## 2022-08-15 VITALS — BP 119/61 | HR 64 | Temp 98.0°F | Resp 16 | Wt 146.0 lb

## 2022-08-15 DIAGNOSIS — F32A Depression, unspecified: Secondary | ICD-10-CM

## 2022-08-15 DIAGNOSIS — F419 Anxiety disorder, unspecified: Secondary | ICD-10-CM

## 2022-08-15 DIAGNOSIS — M8580 Other specified disorders of bone density and structure, unspecified site: Secondary | ICD-10-CM | POA: Diagnosis not present

## 2022-08-15 DIAGNOSIS — K219 Gastro-esophageal reflux disease without esophagitis: Secondary | ICD-10-CM

## 2022-08-15 DIAGNOSIS — E782 Mixed hyperlipidemia: Secondary | ICD-10-CM | POA: Diagnosis not present

## 2022-08-15 DIAGNOSIS — Z85828 Personal history of other malignant neoplasm of skin: Secondary | ICD-10-CM | POA: Diagnosis not present

## 2022-08-15 LAB — COMPREHENSIVE METABOLIC PANEL
ALT: 18 U/L (ref 0–35)
AST: 25 U/L (ref 0–37)
Albumin: 4.6 g/dL (ref 3.5–5.2)
Alkaline Phosphatase: 57 U/L (ref 39–117)
BUN: 18 mg/dL (ref 6–23)
CO2: 28 mEq/L (ref 19–32)
Calcium: 10.6 mg/dL — ABNORMAL HIGH (ref 8.4–10.5)
Chloride: 102 mEq/L (ref 96–112)
Creatinine, Ser: 0.69 mg/dL (ref 0.40–1.20)
GFR: 84.75 mL/min (ref >60.00)
Glucose, Bld: 91 mg/dL (ref 70–99)
Potassium: 4.3 mEq/L (ref 3.5–5.1)
Sodium: 140 mEq/L (ref 135–145)
Total Bilirubin: 0.5 mg/dL (ref 0.2–1.2)
Total Protein: 7.4 g/dL (ref 6.0–8.3)

## 2022-08-15 LAB — LIPID PANEL
Cholesterol: 169 mg/dL (ref 0–200)
HDL: 59.1 mg/dL (ref >39.00)
LDL Cholesterol: 91 mg/dL (ref 0–99)
NonHDL: 109.8
Total CHOL/HDL Ratio: 3
Triglycerides: 94 mg/dL (ref 0.0–149.0)
VLDL: 18.8 mg/dL (ref 0.0–40.0)

## 2022-08-15 MED ORDER — OMEPRAZOLE 40 MG PO CPDR: 40.0000 mg | DELAYED_RELEASE_CAPSULE | Freq: Every day | ORAL | 1 refills | Status: DC

## 2022-08-15 MED ORDER — CALCIUM CARB-CHOLECALCIFEROL 500-5 MG-MCG PO TABS: 1.0000 | ORAL_TABLET | Freq: Every day | ORAL | Status: DC

## 2022-08-15 MED ORDER — ATORVASTATIN CALCIUM 20 MG PO TABS: 20.0000 mg | ORAL_TABLET | Freq: Every day | ORAL | 1 refills | Status: DC

## 2022-08-15 MED ORDER — ESCITALOPRAM OXALATE 10 MG PO TABS: 10.0000 mg | ORAL_TABLET | Freq: Every day | ORAL | 1 refills | Status: DC

## 2022-08-15 NOTE — Assessment & Plan Note (Signed)
Up to date with dermatology.

## 2022-08-15 NOTE — Assessment & Plan Note (Signed)
Stable on omeprazole.  Did not tolerate trying to come off in the past.

## 2022-08-15 NOTE — Assessment & Plan Note (Signed)
Stable on lexapro 10mg.  

## 2022-08-15 NOTE — Assessment & Plan Note (Signed)
Lab Results  Component Value Date   CHOL 166 07/29/2021   HDL 50.80 07/29/2021   LDLCALC 87 07/29/2021   LDLDIRECT 142.9 04/04/2007   TRIG 145.0 07/29/2021   CHOLHDL 3 07/29/2021   Last lipid panel at goal. Repeat, continue lipitor.

## 2022-08-15 NOTE — Assessment & Plan Note (Signed)
Dexa up to date. Continue calcium with D.

## 2022-08-15 NOTE — Progress Notes (Signed)
Subjective:   By signing my name below, I, Madelin Rear, attest that this documentation has been prepared under the direction and in the presence of Debbrah Alar, NP. 08/15/2022.   Patient ID: Samantha Landry, female    DOB: 1946/01/02, 77 y.o.   MRN: [id number]  Chief Complaint  Patient presents with   Anxiety    Here for follow up anxiety and depression   Gastroesophageal Reflux    Doing well on medication    HPI Patient is in today for an office visit.  Mood:  She has been stressed as her husband's health has declined recently. He has dementia. Recently he also suffered a pulmonary embolism. She has not always been able to use her respite hours. She has a good support network from her children. Generally she is feeling comfortable on Lexapro 10 mg.  Acid reflux:  She takes omeprazole every day, which manages her acid reflux well. At one time she had tried to stop this medication but her symptoms returned immediately.  Dexa:  Her last Dexa scan was completed in 2022. She is compliant with her calcium supplement.  Exercise:  When able, she stays active by trying to walk every day.  Dermatology:  She has a pending follow-up with her dermatologist later this Summer.  Past Medical History:  Diagnosis Date   Allergy    Alopecia 08/03/2011   Anxiety    Anxiety and depression 11/25/2015   Arthritis    Benign neoplasm of skin of trunk 11/14/2010   Cancer ([place])    Cataract    Colon polyp    Diverticulosis of colon    DIVERTICULOSIS, COLON 03/26/2007   Qualifier: Diagnosis of  By: Wynona Luna    GERD (gastroesophageal reflux disease)    Heart murmur    HOT FLASHES 03/25/2007   Qualifier: Diagnosis of  By: Wynona Luna    Hyperlipidemia    Melanoma in situ [facility]) 02/2007   right arm   Osteopenia 08/20/2012   PULMONARY NODULE 03/25/2007   Qualifier: Diagnosis of  By: Wynona Luna    Pulmonary nodules 02/2007   2-57m   SKIN CANCER, HX OF 03/26/2007    Qualifier: Diagnosis of  By: YWynona Luna    Past Surgical History:  Procedure Laterality Date   bone spur Left 11/07/2017   thumb   BUNIONECTOMY Left    COLONOSCOPY     DILATION AND CURETTAGE OF UTERUS  2002   uterine fibroids   EYE SURGERY Bilateral 2019   LEFT HEART CATH AND CORONARY ANGIOGRAPHY N/A 06/25/2017   Procedure: LEFT HEART CATH AND CORONARY ANGIOGRAPHY;  Surgeon: HLeonie Man MD;  [facility];  Service: Cardiovascular;  Laterality: N/A;   TONSILLECTOMY      Family History  Problem Relation Age of Onset   Lung cancer Father    Colon cancer Cousin        1st pat.cousin   Rectal cancer Neg Hx    Stomach cancer Neg Hx    Esophageal cancer Neg Hx    Pancreatic cancer Neg Hx     Social History   Socioeconomic History   Marital status: Married    Spouse name: Not on file   Number of children: 2   Years of education: Not on file   Highest education level: Not on file  Occupational History   Occupation: retired    Comment: TPrimary school teacher Tobacco Use  Smoking status: Never   Smokeless tobacco: Never  Substance and Sexual Activity   Alcohol use: Yes    Comment: 4-5 glasses of wine per week   Drug use: No   Sexual activity: Not Currently  Other Topics Concern   Not on file  Social History Narrative   Retired housewife, 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   Daughter is PA @ orthopedic office   Son is a Pharmacologist Strain: [place]  (12/09/2020)   Overall Financial Resource Strain (CARDIA)    Difficulty of Paying Living Expenses: Not hard at all  Food Insecurity: No Food Insecurity (12/09/2020)   Hunger Vital Sign    Worried About Running Out of Food in the Last Year: Never true    [place] in the Last Year: Never true  Transportation Needs: No Transportation Needs (12/09/2020)   PRAPARE - Hydrologist (Medical): No    Lack of  Transportation (Non-Medical): No  Physical Activity: Sufficiently Active (12/09/2020)   Exercise Vital Sign    Days of Exercise per Week: 7 days    Minutes of Exercise per Session: 40 min  Stress: No Stress Concern Present (12/09/2020)   [place]    Feeling of Stress : Only a little  Social Connections: Moderately Integrated (12/09/2020)   Social Connection and Isolation Panel [NHANES]    Frequency of Communication with Friends and Family: More than three times a week    Frequency of Social Gatherings with Friends and Family: More than three times a week    Attends Religious Services: More than 4 times per year    Active Member of [employer] or Organizations: No    Attends Archivist Meetings: Never    Marital Status: Married  Human resources officer Violence: Not At Risk (12/09/2020)   Humiliation, Afraid, Rape, and Kick questionnaire    Fear of Current or Ex-Partner: No    Emotionally Abused: No    Physically Abused: No    Sexually Abused: No    Outpatient Medications Prior to Visit  Medication Sig Dispense Refill   acetaminophen (TYLENOL) 325 MG tablet Take 650 mg by mouth every 6 (six) hours as needed for mild pain or moderate pain.     aspirin 81 MG tablet Take 81 mg by mouth daily.     Calcium-Vitamin D-Vitamin K [id number] MG-UNT-MCG TABS Take 1 tablet by mouth daily.     fish oil-omega-3 fatty acids 1000 MG capsule Take 1 g by mouth daily.     atorvastatin (LIPITOR) 20 MG tablet Take 1 tablet (20 mg total) by mouth daily. 90 tablet 1   escitalopram (LEXAPRO) 10 MG tablet Take 1 tablet (10 mg total) by mouth daily. 90 tablet 1   omeprazole (PRILOSEC) 40 MG capsule Take 1 capsule (40 mg total) by mouth daily. 90 capsule 1   gabapentin (NEURONTIN) 100 MG capsule Take 100 mg by mouth at bedtime.     methocarbamol (ROBAXIN) 500 MG tablet Take 500 mg by mouth in the morning and at bedtime.     Facility-Administered  Medications Prior to Visit  Medication Dose Route Frequency Provider Last Rate Last Admin   0.9 %  sodium chloride infusion  500 mL Intravenous Once Doran Stabler, MD        Allergies  Allergen Reactions   Codeine  Nausea And Vomiting    REACTION: Jittery    ROS See HPI.     Objective:    Physical Exam Constitutional:      Appearance: Normal appearance.  HENT:     Head: Normocephalic and atraumatic.     Right Ear: Tympanic membrane, ear canal and external ear normal.     Left Ear: Tympanic membrane, ear canal and external ear normal.  Eyes:     Extraocular Movements: Extraocular movements intact.     Pupils: Pupils are equal, round, and reactive to light.  Cardiovascular:     Rate and Rhythm: Normal rate and regular rhythm.     Heart sounds: Normal heart sounds. No murmur heard.    No gallop.  Pulmonary:     Effort: Pulmonary effort is normal. No respiratory distress.     Breath sounds: Normal breath sounds. No wheezing or rales.  Skin:    General: Skin is warm and dry.  Neurological:     General: No focal deficit present.     Mental Status: She is alert and oriented to person, place, and time.  Psychiatric:        Mood and Affect: Mood normal.        Behavior: Behavior normal.     BP 119/61 (BP Location: Right Arm, Patient Position: Sitting, Cuff Size: Small)   Pulse 64   Temp 98 F (36.7 C) (Oral)   Resp 16   Wt 146 lb (66.2 kg)   SpO2 99%   BMI 25.86 kg/m  Wt Readings from Last 3 Encounters:  08/15/22 146 lb (66.2 kg)  03/16/22 147 lb (66.7 kg)  02/10/22 148 lb 3.2 oz (67.2 kg)    Problem List Items Addressed This Visit       Unprioritized   SKIN CANCER, HX OF    Up to date with dermatology.       Osteopenia    Dexa up to date. Continue calcium with D.        Hyperlipidemia    Lab Results  Component Value Date   CHOL 166 07/29/2021   HDL 50.80 07/29/2021   LDLCALC 87 07/29/2021   LDLDIRECT 142.9 04/04/2007   TRIG 145.0 07/29/2021    CHOLHDL 3 07/29/2021  Last lipid panel at goal. Repeat, continue lipitor.        Relevant Medications   atorvastatin (LIPITOR) 20 MG tablet   Other Relevant Orders   Lipid panel   Comp Met (CMET)   GERD (gastroesophageal reflux disease)    Stable on omeprazole.  Did not tolerate trying to come off in the past.       Relevant Medications   omeprazole (PRILOSEC) 40 MG capsule   Anxiety and depression - Primary    Stable on lexapro '10mg'$ .        Relevant Medications   escitalopram (LEXAPRO) 10 MG tablet     Meds ordered this encounter  Medications   escitalopram (LEXAPRO) 10 MG tablet    Sig: Take 1 tablet (10 mg total) by mouth daily.    Dispense:  90 tablet    Refill:  1    Order Specific Question:   Supervising Provider    Answer:   Penni Homans A [[id number]]   omeprazole (PRILOSEC) 40 MG capsule    Sig: Take 1 capsule (40 mg total) by mouth daily.    Dispense:  90 capsule    Refill:  1    Order Specific Question:   Supervising Provider  Answer:   Penni Homans A [[id number]]   atorvastatin (LIPITOR) 20 MG tablet    Sig: Take 1 tablet (20 mg total) by mouth daily.    Dispense:  90 tablet    Refill:  1    Order Specific Question:   Supervising Provider    Answer:   Penni Homans A [[id number]]    I, [name], NP, personally preformed the services described in this documentation.  All medical record entries made by the scribe were at my direction and in my presence.  I have reviewed the chart and discharge instructions (if applicable) and agree that the record reflects my personal performance and is accurate and complete. 08/15/2022.  I,Mathew Stumpf,acting as a Education administrator for [employer], NP.,have documented all relevant documentation on the behalf of [name], NP,as directed by  [name], NP while in the presence of [name], NP.   [name], NP

## 2022-11-23 DIAGNOSIS — Z1231 Encounter for screening mammogram for malignant neoplasm of breast: Secondary | ICD-10-CM | POA: Diagnosis not present

## 2022-11-23 LAB — HM MAMMOGRAPHY

## 2022-11-24 ENCOUNTER — Encounter: Payer: Self-pay | Admitting: Family

## 2023-01-18 ENCOUNTER — Encounter (INDEPENDENT_AMBULATORY_CARE_PROVIDER_SITE_OTHER): Payer: Self-pay

## 2023-01-24 DIAGNOSIS — L821 Other seborrheic keratosis: Secondary | ICD-10-CM | POA: Diagnosis not present

## 2023-01-24 DIAGNOSIS — L578 Other skin changes due to chronic exposure to nonionizing radiation: Secondary | ICD-10-CM | POA: Diagnosis not present

## 2023-01-24 DIAGNOSIS — Z85820 Personal history of malignant melanoma of skin: Secondary | ICD-10-CM | POA: Diagnosis not present

## 2023-01-24 DIAGNOSIS — D239 Other benign neoplasm of skin, unspecified: Secondary | ICD-10-CM | POA: Diagnosis not present

## 2023-01-24 DIAGNOSIS — L814 Other melanin hyperpigmentation: Secondary | ICD-10-CM | POA: Diagnosis not present

## 2023-01-24 DIAGNOSIS — D229 Melanocytic nevi, unspecified: Secondary | ICD-10-CM | POA: Diagnosis not present

## 2023-02-16 ENCOUNTER — Ambulatory Visit (INDEPENDENT_AMBULATORY_CARE_PROVIDER_SITE_OTHER): Payer: Medicare HMO | Admitting: *Deleted

## 2023-02-16 DIAGNOSIS — Z0000 Encounter for general adult medical examination without abnormal findings: Secondary | ICD-10-CM | POA: Diagnosis not present

## 2023-02-16 DIAGNOSIS — Z780 Asymptomatic menopausal state: Secondary | ICD-10-CM

## 2023-02-16 NOTE — Progress Notes (Signed)
Subjective:   Samantha Landry is a 77 y.o. female who presents for Medicare Annual (Subsequent) preventive examination.  Visit Complete: Virtual  I connected with  Rafael Bihari on 02/16/23 by a audio enabled telemedicine application and verified that I am speaking with the correct person using two identifiers.  Patient Location: Home  Provider Location: Office/Clinic  I discussed the limitations of evaluation and management by telemedicine. The patient expressed understanding and agreed to proceed.   Cardiac Risk Factors include: advanced age (>52men, >45 women);dyslipidemia     Objective:    Vital Signs: Unable to obtain new vitals due to this being a telehealth visit.      02/16/2023    9:04 AM 02/10/2022    9:00 AM 12/09/2020    9:35 AM 12/03/2019    8:51 AM 11/29/2018    2:41 PM 11/26/2017    3:01 PM 06/25/2017    8:34 AM  Advanced Directives  Does Patient Have a Medical Advance Directive? Yes Yes Yes Yes Yes Yes Yes  Type of Estate agent of [place];Living will Healthcare Power of [place];Living will Healthcare Power of [place];Living will Healthcare Power of [place];Living will Healthcare Power of [place];Living will Healthcare Power of [place];Living will   Does patient want to make changes to medical advance directive? No - Patient declined   No - Patient declined No - Patient declined  No - Patient declined  Copy of Healthcare Power of Attorney in Chart? No - copy requested No - copy requested No - copy requested No - copy requested No - copy requested No - copy requested     Current Medications (verified) Outpatient Encounter Medications as of 02/16/2023  Medication Sig   acetaminophen (TYLENOL) 325 MG tablet Take 650 mg by mouth every 6 (six) hours as needed for mild pain or moderate pain.   aspirin 81 MG tablet Take 81 mg by mouth daily.   atorvastatin (LIPITOR) 20 MG tablet Take 1 tablet (20 mg total) by mouth daily.   Calcium  Carb-Cholecalciferol (CALCIUM 500 + D) 500-5 MG-MCG TABS Take 1 tablet by mouth daily at 6 (six) AM.   escitalopram (LEXAPRO) 10 MG tablet Take 1 tablet (10 mg total) by mouth daily.   fish oil-omega-3 fatty acids 1000 MG capsule Take 1 g by mouth daily.   omeprazole (PRILOSEC) 40 MG capsule Take 1 capsule (40 mg total) by mouth daily.   Facility-Administered Encounter Medications as of 02/16/2023  Medication   0.9 %  sodium chloride infusion    Allergies (verified) Codeine   History: Past Medical History:  Diagnosis Date   Allergy    Alopecia 08/03/2011   Anxiety    Anxiety and depression 11/25/2015   Arthritis    Benign neoplasm of skin of trunk 11/14/2010   Cancer (HCC)    Cataract    Colon polyp    Diverticulosis of colon    DIVERTICULOSIS, COLON 03/26/2007   Qualifier: Diagnosis of  By: Nena Jordan    GERD (gastroesophageal reflux disease)    Heart murmur    HOT FLASHES 03/25/2007   Qualifier: Diagnosis of  By: Nena Jordan    Hyperlipidemia    Melanoma in situ [facility]) 02/2007   right arm   Osteopenia 08/20/2012   PULMONARY NODULE 03/25/2007   Qualifier: Diagnosis of  By: Nena Jordan    Pulmonary nodules 02/2007   2-31mm   SKIN CANCER, HX OF 03/26/2007   Qualifier: Diagnosis of  By: Nena Jordan    Past Surgical History:  Procedure Laterality Date   bone spur Left 11/07/2017   thumb   BUNIONECTOMY Left    COLONOSCOPY     DILATION AND CURETTAGE OF UTERUS  2002   uterine fibroids   EYE SURGERY Bilateral 2019   LEFT HEART CATH AND CORONARY ANGIOGRAPHY N/A 06/25/2017   Procedure: LEFT HEART CATH AND CORONARY ANGIOGRAPHY;  Surgeon: Marykay Lex, MD;  Location: [facility] INVASIVE CV LAB;  Service: Cardiovascular;  Laterality: N/A;   TONSILLECTOMY     Family History  Problem Relation Age of Onset   Lung cancer Father    Colon cancer Cousin        1st pat.cousin   Rectal cancer Neg Hx    Stomach cancer Neg Hx    Esophageal cancer Neg Hx     Pancreatic cancer Neg Hx    Social History   Socioeconomic History   Marital status: Married    Spouse name: Not on file   Number of children: 2   Years of education: Not on file   Highest education level: Not on file  Occupational History   Occupation: retired    Comment: Public librarian  Tobacco Use   Smoking status: Never   Smokeless tobacco: Never  Substance and Sexual Activity   Alcohol use: Yes    Comment: 4-5 glasses of wine per week   Drug use: No   Sexual activity: Not Currently  Other Topics Concern   Not on file  Social History Narrative   Retired housewife, but worked previously as Public librarian. Daughter is PA @ orthopedic office. Son is a Manufacturing engineer Strain: Low Risk  (02/16/2023)   Overall Financial Resource Strain (CARDIA)    Difficulty of Paying Living Expenses: Not hard at all  Food Insecurity: No Food Insecurity (02/16/2023)   Hunger Vital Sign    Worried About Running Out of Food in the Last Year: Never true    Ran Out of Food in the Last Year: Never true  Transportation Needs: No Transportation Needs (02/16/2023)   PRAPARE - Administrator, Civil Service (Medical): No    Lack of Transportation (Non-Medical): No  Physical Activity: Sufficiently Active (02/16/2023)   Exercise Vital Sign    Days of Exercise per Week: 7 days    Minutes of Exercise per Session: 30 min  Stress: No Stress Concern Present (02/16/2023)   [employer] of Occupational Health - Occupational Stress Questionnaire    Feeling of Stress : Only a little  Social Connections: Moderately Integrated (02/16/2023)   Social Connection and Isolation Panel [NHANES]    Frequency of Communication with Friends and Family: More than three times a week    Frequency of Social Gatherings with Friends and Family: Three times a week    Attends Religious Services: 1 to 4 times per year    Active Member of Clubs or Organizations: Yes     Attends Banker Meetings: More than 4 times per year    Marital Status: Widowed    Tobacco Counseling Counseling given: Not Answered   Clinical Intake:  Pre-visit preparation completed: Yes  Pain : No/denies pain  Nutritional Risks: None Diabetes: No  How often do you need to have someone help you when you read instructions, pamphlets, or other written materials from your doctor or pharmacy?: 1 - Never  Interpreter Needed?: No  Information entered  by :: Donne Anon, CMA   Activities of Daily Living    02/16/2023    9:01 AM  In your present state of health, do you have any difficulty performing the following activities:  Hearing? 1  Comment slight hearing loss, worse in left ear  Vision? 0  Difficulty concentrating or making decisions? 0  Walking or climbing stairs? 0  Dressing or bathing? 0  Doing errands, shopping? 0  Preparing Food and eating ? N  Using the Toilet? N  In the past six months, have you accidently leaked urine? N  Do you have problems with loss of bowel control? N  Managing your Medications? N  Managing your Finances? N  Housekeeping or managing your Housekeeping? N    Patient Care Team: Sandford Craze, NP as PCP - General (Internal Medicine) Chevis Pretty, Dimas Aguas, MD as Consulting Physician (Gynecology)  Indicate any recent Medical Services you may have received from other than [facility] providers in the past year (date may be approximate).     Assessment:   This is a routine wellness examination for Samantha Landry.  Hearing/Vision screen No results found.   Goals Addressed   None    Depression Screen    02/16/2023    9:09 AM 08/15/2022   10:09 AM 02/10/2022    9:00 AM 01/31/2022    9:36 AM 07/29/2021    9:16 AM 12/09/2020    9:36 AM 06/24/2020    9:29 AM  PHQ 2/9 Scores  PHQ - 2 Score 0 2 0 1 1 0 0  PHQ- 9 Score  4  3 3  3     Fall Risk    02/16/2023    9:04 AM 02/10/2022    9:00 AM 01/31/2022    9:20 AM 12/09/2020    9:36 AM  12/03/2019    8:55 AM  Fall Risk   Falls in the past year? 0 0 0 1 0  Number falls in past yr: 0 0 0 1 0  Injury with Fall? 0 0 0 1 0  Risk for fall due to : No Fall Risks No Fall Risks     Follow up Falls evaluation completed Falls evaluation completed  Falls prevention discussed Education provided;Falls prevention discussed    MEDICARE RISK AT HOME: Medicare Risk at Home Any stairs in or around the home?: Yes If so, are there any without handrails?: No Home free of loose throw rugs in walkways, pet beds, electrical cords, etc?: Yes Adequate lighting in your home to reduce risk of falls?: Yes Life alert?: No Use of a cane, walker or w/c?: No Grab bars in the bathroom?: Yes Shower chair or bench in shower?: Yes Elevated toilet seat or a handicapped toilet?: Yes  TIMED UP AND GO:  Was the test performed?  No    Cognitive Function:        02/16/2023    9:11 AM 02/10/2022    9:06 AM  6CIT Screen  What Year? 0 points 0 points  What month? 0 points 0 points  What time? 0 points 0 points  Count back from 20 0 points 2 points  Months in reverse 0 points 0 points  Repeat phrase 0 points 2 points  Total Score 0 points 4 points    Immunizations Immunization History  Administered Date(s) Administered   Influenza Split 03/05/2012   Influenza Whole 03/22/2010   Influenza, High Dose Seasonal PF 02/23/2015, 02/25/2016, 02/14/2017   Influenza-Unspecified 02/12/2017, 03/07/2018   Moderna Sars-Covid-2 Vaccination 07/17/2019, 08/25/2019, 04/19/2020  Pneumococcal Conjugate-13 09/22/2013   Pneumococcal Polysaccharide-23 03/05/2012   Tdap 10/26/2010   Zoster Recombinant(Shingrix) 05/02/2017, 07/02/2017   Zoster, Live 10/26/2010    TDAP status: Due, Education has been provided regarding the importance of this vaccine. Advised may receive this vaccine at local pharmacy or Health Dept. Aware to provide a copy of the vaccination record if obtained from local pharmacy or Health Dept.  Verbalized acceptance and understanding.  Flu Vaccine status: Due, Education has been provided regarding the importance of this vaccine. Advised may receive this vaccine at local pharmacy or Health Dept. Aware to provide a copy of the vaccination record if obtained from local pharmacy or Health Dept. Verbalized acceptance and understanding.  Pneumococcal vaccine status: Up to date  Covid-19 vaccine status: Information provided on how to obtain vaccines.   Qualifies for Shingles Vaccine? Yes   Zostavax completed Yes   Shingrix Completed?: Yes  Screening Tests Health Maintenance  Topic Date Due   DTaP/Tdap/Td (2 - Td or Tdap) 10/25/2020   INFLUENZA VACCINE  01/04/2023   Medicare Annual Wellness (AWV)  02/11/2023   Pneumonia Vaccine 8+ Years old  Completed   DEXA SCAN  Completed   Hepatitis C Screening  Completed   Zoster Vaccines- Shingrix  Completed   HPV VACCINES  Aged Out   COVID-19 Vaccine  Discontinued    Health Maintenance  Health Maintenance Due  Topic Date Due   DTaP/Tdap/Td (2 - Td or Tdap) 10/25/2020   INFLUENZA VACCINE  01/04/2023   Medicare Annual Wellness (AWV)  02/11/2023    Colorectal cancer screening: No longer required.   Mammogram status: Completed 11/23/22. Repeat every year  Bone Density status: Ordered 02/16/23. Pt provided with contact info and advised to call to schedule appt.  Lung Cancer Screening: (Low Dose CT Chest recommended if Age 69-80 years, 20 pack-year currently smoking OR have quit w/in 15years.) does not qualify.   Additional Screening:  Hepatitis C Screening: does qualify; Completed 12/19/17  Vision Screening: Recommended annual ophthalmology exams for early detection of glaucoma and other disorders of the eye. Is the patient up to date with their annual eye exam?  Yes  Who is the provider or what is the name of the office in which the patient attends annual eye exams? Doesn't remember name of new eye doctor If pt is not established  with a provider, would they like to be referred to a provider to establish care? No .   Dental Screening: Recommended annual dental exams for proper oral hygiene  Diabetic Foot Exam: N/a  Community Resource Referral / Chronic Care Management: CRR required this visit?  No   CCM required this visit?  No     Plan:     I have personally reviewed and noted the following in the patient's chart:   Medical and social history Use of alcohol, tobacco or illicit drugs  Current medications and supplements including opioid prescriptions. Patient is not currently taking opioid prescriptions. Functional ability and status Nutritional status Physical activity Advanced directives List of other physicians Hospitalizations, surgeries, and ER visits in previous 12 months Vitals Screenings to include cognitive, depression, and falls Referrals and appointments  In addition, I have reviewed and discussed with patient certain preventive protocols, quality metrics, and best practice recommendations. A written personalized care plan for preventive services as well as general preventive health recommendations were provided to patient.     Donne Anon, CMA   02/16/2023   After Visit Summary: (MyChart) Due to this being a  telephonic visit, the after visit summary with patients personalized plan was offered to patient via MyChart   Nurse Notes: None

## 2023-02-16 NOTE — Patient Instructions (Signed)
Samantha Landry , Thank you for taking time to come for your Medicare Wellness Visit. I appreciate your ongoing commitment to your health goals. Please review the following plan we discussed and let me know if I can assist you in the future.     This is a list of the screening recommended for you and due dates:  Health Maintenance  Topic Date Due   DTaP/Tdap/Td vaccine (2 - Td or Tdap) 10/25/2020   Flu Shot  01/04/2023   Medicare Annual Wellness Visit  02/16/2024   Pneumonia Vaccine  Completed   DEXA scan (bone density measurement)  Completed   Hepatitis C Screening  Completed   Zoster (Shingles) Vaccine  Completed   HPV Vaccine  Aged Out   COVID-19 Vaccine  Discontinued    Next appointment: Follow up in one year for your annual wellness visit.   Preventive Care 76 Years and Older, Female Preventive care refers to lifestyle choices and visits with your health care provider that can promote health and wellness. What does preventive care include? A yearly physical exam. This is also called an annual well check. Dental exams once or twice a year. Routine eye exams. Ask your health care provider how often you should have your eyes checked. Personal lifestyle choices, including: Daily care of your teeth and gums. Regular physical activity. Eating a healthy diet. Avoiding tobacco and drug use. Limiting alcohol use. Practicing safe sex. Taking low-dose aspirin every day. Taking vitamin and mineral supplements as recommended by your health care provider. What happens during an annual well check? The services and screenings done by your health care provider during your annual well check will depend on your age, overall health, lifestyle risk factors, and family history of disease. Counseling  Your health care provider may ask you questions about your: Alcohol use. Tobacco use. Drug use. Emotional well-being. Home and relationship well-being. Sexual activity. Eating habits. History of  falls. Memory and ability to understand (cognition). Work and work Astronomer. Reproductive health. Screening  You may have the following tests or measurements: Height, weight, and BMI. Blood pressure. Lipid and cholesterol levels. These may be checked every 5 years, or more frequently if you are over 1 years old. Skin check. Lung cancer screening. You may have this screening every year starting at age 76 if you have a 30-pack-year history of smoking and currently smoke or have quit within the past 15 years. Fecal occult blood test (FOBT) of the stool. You may have this test every year starting at age 76. Flexible sigmoidoscopy or colonoscopy. You may have a sigmoidoscopy every 5 years or a colonoscopy every 10 years starting at age 76. Hepatitis C blood test. Hepatitis B blood test. Sexually transmitted disease (STD) testing. Diabetes screening. This is done by checking your blood sugar (glucose) after you have not eaten for a while (fasting). You may have this done every 1-3 years. Bone density scan. This is done to screen for osteoporosis. You may have this done starting at age 76. Mammogram. This may be done every 1-2 years. Talk to your health care provider about how often you should have regular mammograms. Talk with your health care provider about your test results, treatment options, and if necessary, the need for more tests. Vaccines  Your health care provider may recommend certain vaccines, such as: Influenza vaccine. This is recommended every year. Tetanus, diphtheria, and acellular pertussis (Tdap, Td) vaccine. You may need a Td booster every 10 years. Zoster vaccine. You may need this  after age 76. Pneumococcal 13-valent conjugate (PCV13) vaccine. One dose is recommended after age 76. Pneumococcal polysaccharide (PPSV23) vaccine. One dose is recommended after age 76. Talk to your health care provider about which screenings and vaccines you need and how often you need  them. This information is not intended to replace advice given to you by your health care provider. Make sure you discuss any questions you have with your health care provider. Document Released: 06/18/2015 Document Revised: 02/09/2016 Document Reviewed: 03/23/2015 Elsevier Interactive Patient Education  2017 [employer].  Fall Prevention in the Home Falls can cause injuries. They can happen to people of all ages. There are many things you can do to make your home safe and to help prevent falls. What can I do on the outside of my home? Regularly fix the edges of walkways and driveways and fix any cracks. Remove anything that might make you trip as you walk through a door, such as a raised step or threshold. Trim any bushes or trees on the path to your home. Use bright outdoor lighting. Clear any walking paths of anything that might make someone trip, such as rocks or tools. Regularly check to see if handrails are loose or broken. Make sure that both sides of any steps have handrails. Any raised decks and porches should have guardrails on the edges. Have any leaves, snow, or ice cleared regularly. Use sand or salt on walking paths during winter. Clean up any spills in your garage right away. This includes oil or grease spills. What can I do in the bathroom? Use night lights. Install grab bars by the toilet and in the tub and shower. Do not use towel bars as grab bars. Use non-skid mats or decals in the tub or shower. If you need to sit down in the shower, use a plastic, non-slip stool. Keep the floor dry. Clean up any water that spills on the floor as soon as it happens. Remove soap buildup in the tub or shower regularly. Attach bath mats securely with double-sided non-slip rug tape. Do not have throw rugs and other things on the floor that can make you trip. What can I do in the bedroom? Use night lights. Make sure that you have a light by your bed that is easy to reach. Do not use  any sheets or blankets that are too big for your bed. They should not hang down onto the floor. Have a firm chair that has side arms. You can use this for support while you get dressed. Do not have throw rugs and other things on the floor that can make you trip. What can I do in the kitchen? Clean up any spills right away. Avoid walking on wet floors. Keep items that you use a lot in easy-to-reach places. If you need to reach something above you, use a strong step stool that has a grab bar. Keep electrical cords out of the way. Do not use floor polish or wax that makes floors slippery. If you must use wax, use non-skid floor wax. Do not have throw rugs and other things on the floor that can make you trip. What can I do with my stairs? Do not leave any items on the stairs. Make sure that there are handrails on both sides of the stairs and use them. Fix handrails that are broken or loose. Make sure that handrails are as long as the stairways. Check any carpeting to make sure that it is firmly attached to the  stairs. Fix any carpet that is loose or worn. Avoid having throw rugs at the top or bottom of the stairs. If you do have throw rugs, attach them to the floor with carpet tape. Make sure that you have a light switch at the top of the stairs and the bottom of the stairs. If you do not have them, ask someone to add them for you. What else can I do to help prevent falls? Wear shoes that: Do not have high heels. Have rubber bottoms. Are comfortable and fit you well. Are closed at the toe. Do not wear sandals. If you use a stepladder: Make sure that it is fully opened. Do not climb a closed stepladder. Make sure that both sides of the stepladder are locked into place. Ask someone to hold it for you, if possible. Clearly mark and make sure that you can see: Any grab bars or handrails. First and last steps. Where the edge of each step is. Use tools that help you move around (mobility aids)  if they are needed. These include: Canes. Walkers. Scooters. Crutches. Turn on the lights when you go into a dark area. Replace any light bulbs as soon as they burn out. Set up your furniture so you have a clear path. Avoid moving your furniture around. If any of your floors are uneven, fix them. If there are any pets around you, be aware of where they are. Review your medicines with your doctor. Some medicines can make you feel dizzy. This can increase your chance of falling. Ask your doctor what other things that you can do to help prevent falls. This information is not intended to replace advice given to you by your health care provider. Make sure you discuss any questions you have with your health care provider. Document Released: 03/18/2009 Document Revised: 10/28/2015 Document Reviewed: 06/26/2014 Elsevier Interactive Patient Education  2017 [employer].

## 2023-02-20 ENCOUNTER — Ambulatory Visit: Payer: Medicare HMO | Admitting: Family

## 2023-02-20 VITALS — BP 124/56 | HR 71 | Temp 98.0°F | Resp 16 | Wt 139.0 lb

## 2023-02-20 DIAGNOSIS — F419 Anxiety disorder, unspecified: Secondary | ICD-10-CM

## 2023-02-20 DIAGNOSIS — Z23 Encounter for immunization: Secondary | ICD-10-CM

## 2023-02-20 DIAGNOSIS — K219 Gastro-esophageal reflux disease without esophagitis: Secondary | ICD-10-CM | POA: Diagnosis not present

## 2023-02-20 DIAGNOSIS — M8580 Other specified disorders of bone density and structure, unspecified site: Secondary | ICD-10-CM

## 2023-02-20 DIAGNOSIS — N951 Menopausal and female climacteric states: Secondary | ICD-10-CM | POA: Diagnosis not present

## 2023-02-20 DIAGNOSIS — F32A Depression, unspecified: Secondary | ICD-10-CM

## 2023-02-20 DIAGNOSIS — E782 Mixed hyperlipidemia: Secondary | ICD-10-CM | POA: Diagnosis not present

## 2023-02-20 MED ORDER — ESCITALOPRAM OXALATE 10 MG PO TABS: 10.0000 mg | ORAL_TABLET | Freq: Every day | ORAL | 1 refills | Status: DC

## 2023-02-20 MED ORDER — ATORVASTATIN CALCIUM 20 MG PO TABS: 20.0000 mg | ORAL_TABLET | Freq: Every day | ORAL | 1 refills | Status: DC

## 2023-02-20 NOTE — Assessment & Plan Note (Signed)
Continue on Prilosec Stable at this time.

## 2023-02-20 NOTE — Assessment & Plan Note (Signed)
Continue on the calcium supplement

## 2023-02-20 NOTE — Patient Instructions (Signed)
VISIT SUMMARY:  During your recent visit, we discussed the challenges you've been facing following the recent loss of your spouse. We also talked about your health and wellness, including your vitamin supplements and recent vaccinations. We made plans for future health checks, including an eye exam and a bone scan.  YOUR PLAN:  -GRIEF: You've been dealing with the loss of your spouse. It's good to hear that you have a strong support system and are part of a widows group. If you feel the need, consider seeking additional grief counseling.  -HYPERCALCEMIA: Previously, you had a high level of calcium in your blood. We've advised you to reduce your calcium supplement intake. We'll repeat some tests to check your calcium and parathyroid hormone levels, which help control calcium levels in your body.  INSTRUCTIONS:  You've recently received your flu shot and we recommend getting a COVID-19 booster shot at your local pharmacy. Please schedule an eye exam as planned. Continue taking your current medications, including Lexapro and Lipitor. We've ordered a bone scan at [facility] for you. We'll also check your vitamin D and B12 levels due to your supplement use, and perform a Comprehensive Metabolic Panel (CMP) test, which measures your blood sugar level, electrolyte and fluid balance, and kidney and liver function. We'll follow up in six months to see how you're doing.

## 2023-02-20 NOTE — Progress Notes (Signed)
Subjective:     Patient ID: Samantha Landry, female    DOB: [birth year], 77 y.o.   MRN: [id number]  Chief Complaint  Patient presents with   Depression    Here for follow up   Greaving process    Husband passed away in 12-06-22    HPI  Discussed the use of AI scribe software for clinical note transcription with the patient, who gave verbal consent to proceed.  History of Present Illness   The patient, recently widowed in 12-06-22, presents for a routine follow up.  She discusses the emotional and logistical challenges she has faced since his passing, including dealing with insurance and other administrative tasks. She reports having a good support system, including her children and a group of widowed ladies. She also mentions plans for future trips and activities, indicating a proactive approach to navigating her new life circumstances.  The patient does not report any specific health concerns. She mentions taking a vitamin D supplement and B12, and she has recently received a flu shot. She also discusses plans to get a COVID booster shot at the pharmacy. She is due for an eye exam and plans to schedule it. She also mentions a bone scan that was ordered during a previous wellness check.          Health Maintenance Due  Topic Date Due   DTaP/Tdap/Td (2 - Td or Tdap) 10/25/2020    Past Medical History:  Diagnosis Date   Allergy    Alopecia 08/03/2011   Anxiety    Anxiety and depression 11/25/2015   Arthritis    Benign neoplasm of skin of trunk 11/14/2010   Cancer (HCC)    Cataract    Colon polyp    Diverticulosis of colon    DIVERTICULOSIS, COLON 03/26/2007   Qualifier: Diagnosis of  By: Nena Jordan    GERD (gastroesophageal reflux disease)    Heart murmur    HOT FLASHES 03/25/2007   Qualifier: Diagnosis of  By: Nena Jordan    Hyperlipidemia    Melanoma in situ [facility]) 02/2007   right arm   Osteopenia 08/20/2012   PULMONARY NODULE 03/25/2007   Qualifier: Diagnosis  of  By: Nena Jordan    Pulmonary nodules 02/2007   2-70mm   SKIN CANCER, HX OF 03/26/2007   Qualifier: Diagnosis of  By: Nena Jordan     Past Surgical History:  Procedure Laterality Date   bone spur Left 11/07/2017   thumb   BUNIONECTOMY Left    COLONOSCOPY     DILATION AND CURETTAGE OF UTERUS  2002   uterine fibroids   EYE SURGERY Bilateral 2019   LEFT HEART CATH AND CORONARY ANGIOGRAPHY N/A 06/25/2017   Procedure: LEFT HEART CATH AND CORONARY ANGIOGRAPHY;  Surgeon: Marykay Lex, MD;  Location: [facility] INVASIVE CV LAB;  Service: Cardiovascular;  Laterality: N/A;   TONSILLECTOMY      Family History  Problem Relation Age of Onset   Lung cancer Father    Colon cancer Cousin        1st pat.cousin   Rectal cancer Neg Hx    Stomach cancer Neg Hx    Esophageal cancer Neg Hx    Pancreatic cancer Neg Hx     Social History   Socioeconomic History   Marital status: Widowed    Spouse name: Not on file   Number of children: 2   Years of education: Not on file  Highest education level: Not on file  Occupational History   Occupation: retired    Comment: Public librarian  Tobacco Use   Smoking status: Never   Smokeless tobacco: Never  Substance and Sexual Activity   Alcohol use: Yes    Comment: 4-5 glasses of wine per week   Drug use: No   Sexual activity: Not Currently  Other Topics Concern   Not on file  Social History Narrative   Retired housewife, but worked previously as Public librarian. Daughter is PA @ orthopedic office. Son is a Manufacturing engineer Strain: Low Risk  (02/16/2023)   Overall Financial Resource Strain (CARDIA)    Difficulty of Paying Living Expenses: Not hard at all  Food Insecurity: No Food Insecurity (02/16/2023)   Hunger Vital Sign    Worried About Running Out of Food in the Last Year: Never true    Ran Out of Food in the Last Year: Never true  Transportation Needs: No Transportation Needs  (02/16/2023)   PRAPARE - Administrator, Civil Service (Medical): No    Lack of Transportation (Non-Medical): No  Physical Activity: Sufficiently Active (02/16/2023)   Exercise Vital Sign    Days of Exercise per Week: 7 days    Minutes of Exercise per Session: 30 min  Stress: No Stress Concern Present (02/16/2023)   [employer] of Occupational Health - Occupational Stress Questionnaire    Feeling of Stress : Only a little  Social Connections: Moderately Integrated (02/16/2023)   Social Connection and Isolation Panel [NHANES]    Frequency of Communication with Friends and Family: More than three times a week    Frequency of Social Gatherings with Friends and Family: Three times a week    Attends Religious Services: 1 to 4 times per year    Active Member of Clubs or Organizations: Yes    Attends Banker Meetings: More than 4 times per year    Marital Status: Widowed  Intimate Partner Violence: Not At Risk (02/16/2023)   Humiliation, Afraid, Rape, and Kick questionnaire    Fear of Current or Ex-Partner: No    Emotionally Abused: No    Physically Abused: No    Sexually Abused: No    Outpatient Medications Prior to Visit  Medication Sig Dispense Refill   acetaminophen (TYLENOL) 325 MG tablet Take 650 mg by mouth every 6 (six) hours as needed for mild pain or moderate pain.     aspirin 81 MG tablet Take 81 mg by mouth daily.     Calcium Carb-Cholecalciferol (CALCIUM 500 + D) 500-5 MG-MCG TABS Take 1 tablet by mouth daily at 6 (six) AM.     fish oil-omega-3 fatty acids 1000 MG capsule Take 1 g by mouth daily.     atorvastatin (LIPITOR) 20 MG tablet Take 1 tablet (20 mg total) by mouth daily. 90 tablet 1   escitalopram (LEXAPRO) 10 MG tablet Take 1 tablet (10 mg total) by mouth daily. 90 tablet 1   omeprazole (PRILOSEC) 40 MG capsule Take 1 capsule (40 mg total) by mouth daily. (Patient not taking: Reported on 02/20/2023) 90 capsule 1   0.9 %  sodium chloride  infusion      No facility-administered medications prior to visit.    Allergies  Allergen Reactions   Codeine Nausea And Vomiting    REACTION: Jittery    ROS    See HPI Objective:    Physical Exam Constitutional:  General: She is not in acute distress.    Appearance: Normal appearance. She is well-developed.  HENT:     Head: Normocephalic and atraumatic.     Right Ear: External ear normal.     Left Ear: External ear normal.  Eyes:     General: No scleral icterus. Neck:     Thyroid: No thyromegaly.  Cardiovascular:     Rate and Rhythm: Normal rate and regular rhythm.     Heart sounds: Normal heart sounds. No murmur heard. Pulmonary:     Effort: Pulmonary effort is normal. No respiratory distress.     Breath sounds: Normal breath sounds. No wheezing.  Musculoskeletal:     Cervical back: Neck supple.  Skin:    General: Skin is warm and dry.  Neurological:     Mental Status: She is alert and oriented to person, place, and time.  Psychiatric:        Mood and Affect: Mood normal.        Behavior: Behavior normal.        Thought Content: Thought content normal.        Judgment: Judgment normal.      BP (!) 124/56 (BP Location: Right Arm, Patient Position: Sitting, Cuff Size: Small)   Pulse 71   Temp 98 F (36.7 C) (Oral)   Resp 16   Wt 139 lb (63 kg)   SpO2 100%   BMI 24.62 kg/m  Wt Readings from Last 3 Encounters:  02/20/23 139 lb (63 kg)  08/15/22 146 lb (66.2 kg)  03/16/22 147 lb (66.7 kg)       Assessment & Plan:   Problem List Items Addressed This Visit       Unprioritized   Osteopenia    Continue on the calcium supplement      Hyperlipidemia    Recheck lipid levels today. Last Lipids panel up to date.    Lab Results  Component Value Date   CHOL 169 08/15/2022   HDL 59.10 08/15/2022   LDLCALC 91 08/15/2022   LDLDIRECT 142.9 04/04/2007   TRIG 94.0 08/15/2022   CHOLHDL 3 08/15/2022         Relevant Medications   atorvastatin  (LIPITOR) 20 MG tablet   HOT FLASHES    Stable at this time      GERD (gastroesophageal reflux disease)    Continue on Prilosec Stable at this time.       Anxiety and depression - Primary    Stable at this time. Continue on Lexapro at this time       Relevant Medications   escitalopram (LEXAPRO) 10 MG tablet   Other Visit Diagnoses     Hypercalcemia       Relevant Orders   PTH, Intact and Calcium   Needs flu shot       Relevant Orders   Flu Vaccine Trivalent High Dose (Fluad) (Completed)       I have discontinued [place]. [place]'s omeprazole. I am also having her maintain her aspirin, fish oil-omega-3 fatty acids, acetaminophen, Calcium Carb-Cholecalciferol, escitalopram, and atorvastatin. We will stop administering sodium chloride.  Meds ordered this encounter  Medications   escitalopram (LEXAPRO) 10 MG tablet    Sig: Take 1 tablet (10 mg total) by mouth daily.    Dispense:  90 tablet    Refill:  1    Order Specific Question:   Supervising Provider    Answer:   Danise Edge A [[id number]]   atorvastatin (LIPITOR) 20  MG tablet    Sig: Take 1 tablet (20 mg total) by mouth daily.    Dispense:  90 tablet    Refill:  1    Order Specific Question:   Supervising Provider    Answer:   Danise Edge A [[id number]]

## 2023-02-20 NOTE — Progress Notes (Signed)
Subjective:     Patient ID: Samantha Landry, female    DOB: 04/27/1946, 77 y.o.   MRN: [id number]  Chief Complaint  Patient presents with   Depression    Here for follow up   Greaving process    Husband passed away in 01-Dec-2022    Depression        Associated symptoms include no headaches.   Discussed the use of AI scribe software for clinical note transcription with the patient, who gave verbal consent to proceed.  77 year old female presents to the clinic today for follow up for depression. She states that her husband just recently passed away in 01-Dec-2022 due to his demenia. She states that she does have good days and bad days with her depression. Patient states that she does have a good support system with her kids and making sure she is taken care of. She told me that her kids take her out to eat about twice a week to check in on her and make sure she is doing ok. She states that the Lexapro is doing ok for her at this time. She states that her diet is doing ok. She states that she is trying to eat healthy/          Health Maintenance Due  Topic Date Due   DTaP/Tdap/Td (2 - Td or Tdap) 10/25/2020   INFLUENZA VACCINE  01/04/2023    Past Medical History:  Diagnosis Date   Allergy    Alopecia 08/03/2011   Anxiety    Anxiety and depression 11/25/2015   Arthritis    Benign neoplasm of skin of trunk 11/14/2010   Cancer (HCC)    Cataract    Colon polyp    Diverticulosis of colon    DIVERTICULOSIS, COLON 03/26/2007   Qualifier: Diagnosis of  By: Nena Jordan    GERD (gastroesophageal reflux disease)    Heart murmur    HOT FLASHES 03/25/2007   Qualifier: Diagnosis of  By: Nena Jordan    Hyperlipidemia    Melanoma in situ [facility]) 02/2007   right arm   Osteopenia 08/20/2012   PULMONARY NODULE 03/25/2007   Qualifier: Diagnosis of  By: Nena Jordan    Pulmonary nodules 02/2007   2-20mm   SKIN CANCER, HX OF 03/26/2007   Qualifier: Diagnosis of  By: Nena Jordan     Past Surgical History:  Procedure Laterality Date   bone spur Left 11/07/2017   thumb   BUNIONECTOMY Left    COLONOSCOPY     DILATION AND CURETTAGE OF UTERUS  2002   uterine fibroids   EYE SURGERY Bilateral 2019   LEFT HEART CATH AND CORONARY ANGIOGRAPHY N/A 06/25/2017   Procedure: LEFT HEART CATH AND CORONARY ANGIOGRAPHY;  Surgeon: Marykay Lex, MD;  Location: [facility] INVASIVE CV LAB;  Service: Cardiovascular;  Laterality: N/A;   TONSILLECTOMY      Family History  Problem Relation Age of Onset   Lung cancer Father    Colon cancer Cousin        1st pat.cousin   Rectal cancer Neg Hx    Stomach cancer Neg Hx    Esophageal cancer Neg Hx    Pancreatic cancer Neg Hx     Social History   Socioeconomic History   Marital status: Widowed    Spouse name: Not on file   Number of children: 2   Years of education: Not on file  Highest education level: Not on file  Occupational History   Occupation: retired    Comment: Public librarian  Tobacco Use   Smoking status: Never   Smokeless tobacco: Never  Substance and Sexual Activity   Alcohol use: Yes    Comment: 4-5 glasses of wine per week   Drug use: No   Sexual activity: Not Currently  Other Topics Concern   Not on file  Social History Narrative   Retired housewife, but worked previously as Public librarian. Daughter is PA @ orthopedic office. Son is a Manufacturing engineer Strain: Low Risk  (02/16/2023)   Overall Financial Resource Strain (CARDIA)    Difficulty of Paying Living Expenses: Not hard at all  Food Insecurity: No Food Insecurity (02/16/2023)   Hunger Vital Sign    Worried About Running Out of Food in the Last Year: Never true    Ran Out of Food in the Last Year: Never true  Transportation Needs: No Transportation Needs (02/16/2023)   PRAPARE - Administrator, Civil Service (Medical): No    Lack of Transportation (Non-Medical): No  Physical  Activity: Sufficiently Active (02/16/2023)   Exercise Vital Sign    Days of Exercise per Week: 7 days    Minutes of Exercise per Session: 30 min  Stress: No Stress Concern Present (02/16/2023)   [employer] of Occupational Health - Occupational Stress Questionnaire    Feeling of Stress : Only a little  Social Connections: Moderately Integrated (02/16/2023)   Social Connection and Isolation Panel [NHANES]    Frequency of Communication with Friends and Family: More than three times a week    Frequency of Social Gatherings with Friends and Family: Three times a week    Attends Religious Services: 1 to 4 times per year    Active Member of Clubs or Organizations: Yes    Attends Banker Meetings: More than 4 times per year    Marital Status: Widowed  Intimate Partner Violence: Not At Risk (02/16/2023)   Humiliation, Afraid, Rape, and Kick questionnaire    Fear of Current or Ex-Partner: No    Emotionally Abused: No    Physically Abused: No    Sexually Abused: No    Outpatient Medications Prior to Visit  Medication Sig Dispense Refill   acetaminophen (TYLENOL) 325 MG tablet Take 650 mg by mouth every 6 (six) hours as needed for mild pain or moderate pain.     aspirin 81 MG tablet Take 81 mg by mouth daily.     atorvastatin (LIPITOR) 20 MG tablet Take 1 tablet (20 mg total) by mouth daily. 90 tablet 1   Calcium Carb-Cholecalciferol (CALCIUM 500 + D) 500-5 MG-MCG TABS Take 1 tablet by mouth daily at 6 (six) AM.     escitalopram (LEXAPRO) 10 MG tablet Take 1 tablet (10 mg total) by mouth daily. 90 tablet 1   fish oil-omega-3 fatty acids 1000 MG capsule Take 1 g by mouth daily.     omeprazole (PRILOSEC) 40 MG capsule Take 1 capsule (40 mg total) by mouth daily. (Patient not taking: Reported on 02/20/2023) 90 capsule 1   Facility-Administered Medications Prior to Visit  Medication Dose Route Frequency Provider Last Rate Last Admin   0.9 %  sodium chloride infusion  500 mL  Intravenous Once Sherrilyn Rist, MD        Allergies  Allergen Reactions   Codeine Nausea And Vomiting  REACTION: Jittery    Review of Systems  Constitutional:  Negative for chills and diaphoresis.  HENT:  Negative for hearing loss, nosebleeds and sore throat.   Eyes:  Negative for pain.  Respiratory:  Negative for cough and shortness of breath.   Cardiovascular:  Negative for chest pain.  Gastrointestinal:  Negative for nausea and vomiting.  Musculoskeletal:  Negative for joint pain and neck pain.  Neurological:  Negative for dizziness and headaches.  Psychiatric/Behavioral:  Positive for depression.        Objective:    Physical Exam Vitals reviewed.  Constitutional:      Appearance: Normal appearance. She is normal weight.  HENT:     Head: Normocephalic.     Nose: Nose normal.  Cardiovascular:     Rate and Rhythm: Normal rate and regular rhythm.     Pulses: Normal pulses.     Heart sounds: Normal heart sounds.  Pulmonary:     Effort: Pulmonary effort is normal.     Breath sounds: Normal breath sounds.  Musculoskeletal:        General: Normal range of motion.     Cervical back: Normal range of motion.  Skin:    General: Skin is warm.  Neurological:     General: No focal deficit present.     Mental Status: She is alert and oriented to person, place, and time. Mental status is at baseline.  Psychiatric:        Mood and Affect: Mood normal.        Behavior: Behavior normal.        Thought Content: Thought content normal.        Judgment: Judgment normal.      BP (!) 124/56 (BP Location: Right Arm, Patient Position: Sitting, Cuff Size: Small)   Pulse 71   Temp 98 F (36.7 C) (Oral)   Resp 16   Wt 139 lb (63 kg)   SpO2 100%   BMI 24.62 kg/m  Wt Readings from Last 3 Encounters:  02/20/23 139 lb (63 kg)  08/15/22 146 lb (66.2 kg)  03/16/22 147 lb (66.7 kg)       Assessment & Plan:   Problem List Items Addressed This Visit   None   I am  having Okey Regal E. Liggett maintain her aspirin, fish oil-omega-3 fatty acids, acetaminophen, escitalopram, omeprazole, atorvastatin, and Calcium Carb-Cholecalciferol. We will continue to administer sodium chloride.  No orders of the defined types were placed in this encounter.

## 2023-02-20 NOTE — Assessment & Plan Note (Signed)
Stable at this time 

## 2023-02-20 NOTE — Assessment & Plan Note (Signed)
Stable at this time. Continue on Lexapro at this time

## 2023-02-20 NOTE — Assessment & Plan Note (Addendum)
Recheck lipid levels today. Last Lipids panel up to date.    Lab Results  Component Value Date   CHOL 169 08/15/2022   HDL 59.10 08/15/2022   LDLCALC 91 08/15/2022   LDLDIRECT 142.9 04/04/2007   TRIG 94.0 08/15/2022   CHOLHDL 3 08/15/2022

## 2023-02-23 LAB — PTH, INTACT AND CALCIUM: Calcium: 10.1 mg/dL (ref 8.6–10.4)

## 2023-02-27 ENCOUNTER — Telehealth: Payer: Self-pay | Admitting: *Deleted

## 2023-02-27 NOTE — Telephone Encounter (Signed)
Received fax from lab that they were unable to run PTH w/calcium due to specimen not being frozen when it was received by the lab.  Notified pt and apologized for inconvenience. Pt is agreeable to return on 03/07/23 for repeat lab draw and appt has been scheduled.

## 2023-03-07 ENCOUNTER — Other Ambulatory Visit (INDEPENDENT_AMBULATORY_CARE_PROVIDER_SITE_OTHER): Payer: Medicare HMO

## 2023-03-07 DIAGNOSIS — E785 Hyperlipidemia, unspecified: Secondary | ICD-10-CM | POA: Diagnosis not present

## 2023-03-07 DIAGNOSIS — Z85820 Personal history of malignant melanoma of skin: Secondary | ICD-10-CM | POA: Diagnosis not present

## 2023-03-07 DIAGNOSIS — Z8262 Family history of osteoporosis: Secondary | ICD-10-CM | POA: Diagnosis not present

## 2023-03-07 LAB — HM DEXA SCAN: HM Dexa Scan: NORMAL

## 2023-03-08 ENCOUNTER — Encounter: Payer: Self-pay | Admitting: Family

## 2023-03-08 LAB — PTH, INTACT AND CALCIUM
Calcium: 9.9 mg/dL (ref 8.6–10.4)
PTH: 47 pg/mL (ref 16–77)

## 2023-03-14 DIAGNOSIS — Z961 Presence of intraocular lens: Secondary | ICD-10-CM | POA: Diagnosis not present

## 2023-03-14 DIAGNOSIS — Z9889 Other specified postprocedural states: Secondary | ICD-10-CM | POA: Diagnosis not present

## 2023-06-25 DIAGNOSIS — M7918 Myalgia, other site: Secondary | ICD-10-CM | POA: Diagnosis not present

## 2023-06-25 DIAGNOSIS — M542 Cervicalgia: Secondary | ICD-10-CM | POA: Diagnosis not present

## 2023-07-23 DIAGNOSIS — M542 Cervicalgia: Secondary | ICD-10-CM | POA: Diagnosis not present

## 2023-08-01 DIAGNOSIS — M542 Cervicalgia: Secondary | ICD-10-CM | POA: Diagnosis not present

## 2023-08-08 DIAGNOSIS — M542 Cervicalgia: Secondary | ICD-10-CM | POA: Diagnosis not present

## 2023-08-15 DIAGNOSIS — M542 Cervicalgia: Secondary | ICD-10-CM | POA: Diagnosis not present

## 2023-08-20 ENCOUNTER — Ambulatory Visit: Payer: Medicare HMO | Admitting: Family

## 2023-09-05 ENCOUNTER — Ambulatory Visit: Admitting: Family

## 2023-09-07 ENCOUNTER — Encounter: Payer: Self-pay | Admitting: Family

## 2023-09-07 ENCOUNTER — Ambulatory Visit: Admitting: Family

## 2023-09-07 VITALS — BP 117/52 | HR 64 | Temp 98.4°F | Resp 16 | Ht 63.0 in | Wt 139.0 lb

## 2023-09-07 DIAGNOSIS — F419 Anxiety disorder, unspecified: Secondary | ICD-10-CM

## 2023-09-07 DIAGNOSIS — E782 Mixed hyperlipidemia: Secondary | ICD-10-CM | POA: Diagnosis not present

## 2023-09-07 DIAGNOSIS — M8580 Other specified disorders of bone density and structure, unspecified site: Secondary | ICD-10-CM

## 2023-09-07 DIAGNOSIS — F32A Depression, unspecified: Secondary | ICD-10-CM

## 2023-09-07 DIAGNOSIS — K219 Gastro-esophageal reflux disease without esophagitis: Secondary | ICD-10-CM | POA: Diagnosis not present

## 2023-09-07 LAB — COMPREHENSIVE METABOLIC PANEL WITH GFR
ALT: 13 U/L (ref 0–35)
AST: 20 U/L (ref 0–37)
Albumin: 4.8 g/dL (ref 3.5–5.2)
Alkaline Phosphatase: 48 U/L (ref 39–117)
BUN: 22 mg/dL (ref 6–23)
CO2: 29 meq/L (ref 19–32)
Calcium: 10.2 mg/dL (ref 8.4–10.5)
Chloride: 104 meq/L (ref 96–112)
Creatinine, Ser: 0.74 mg/dL (ref 0.40–1.20)
GFR: 78.42 mL/min (ref >60.00)
Glucose, Bld: 85 mg/dL (ref 70–99)
Potassium: 4 meq/L (ref 3.5–5.1)
Sodium: 141 meq/L (ref 135–145)
Total Bilirubin: 0.6 mg/dL (ref 0.2–1.2)
Total Protein: 7 g/dL (ref 6.0–8.3)

## 2023-09-07 LAB — LIPID PANEL
Cholesterol: 138 mg/dL (ref 0–200)
HDL: 50.9 mg/dL (ref >39.00)
LDL Cholesterol: 62 mg/dL (ref 0–99)
NonHDL: 87.36
Total CHOL/HDL Ratio: 3
Triglycerides: 127 mg/dL (ref 0.0–149.0)
VLDL: 25.4 mg/dL (ref 0.0–40.0)

## 2023-09-07 MED ORDER — ESCITALOPRAM OXALATE 10 MG PO TABS
5.0000 mg | ORAL_TABLET | Freq: Every day | ORAL | 0 refills | Status: DC
Start: 2023-09-07 — End: 2023-12-21

## 2023-09-07 MED ORDER — ESCITALOPRAM OXALATE 10 MG PO TABS: 10.0000 mg | ORAL_TABLET | Freq: Every day | ORAL | 1 refills | Status: DC

## 2023-09-07 MED ORDER — ATORVASTATIN CALCIUM 20 MG PO TABS: 20.0000 mg | ORAL_TABLET | Freq: Every day | ORAL | 1 refills | Status: DC

## 2023-09-07 NOTE — Progress Notes (Addendum)
   Established Patient Office Visit  Subjective   Patient ID: LATESSA TILLIS, female    DOB: February 26, 1946  Age: 78 y.o. MRN: [id number]  Chief Complaint  Patient presents with   Hyperlipidemia    Here for follow up    Depression    Here for follow up, "doing well"   Anxiety    Here for follow up   Very pleasant 78 yo here for routine follow-up. Patient reports that her mood is much better and she feels that her depression and anxiety are well-controlled at this time. She states that she has been spending time with her children and grand-dogs which brings her a lot of joy. She also reports that she has become more involved in community activities which she really enjoys. She denies any complaints at this time.    Review of Systems  Psychiatric/Behavioral:  Negative for depression.   See HPI   Objective:     BP (!) 117/52 (BP Location: Right Arm, Patient Position: Sitting, Cuff Size: Normal)   Pulse 64   Temp 98.4 F (36.9 C) (Oral)   Resp 16   Ht 5\' 3"  (1.6 m)   Wt 139 lb (63 kg)   SpO2 98%   BMI 24.62 kg/m    Physical Exam Vitals reviewed.  Constitutional:      Appearance: Normal appearance.  HENT:     Head: Normocephalic and atraumatic.     Right Ear: External ear normal.     Left Ear: External ear normal.     Mouth/Throat:     Mouth: Mucous membranes are moist.     Pharynx: Oropharynx is clear.  Cardiovascular:     Rate and Rhythm: Normal rate and regular rhythm.     Pulses: Normal pulses.     Heart sounds: Normal heart sounds.  Pulmonary:     Effort: Pulmonary effort is normal.     Breath sounds: Normal breath sounds.  Musculoskeletal:        General: No swelling.  Skin:    General: Skin is warm and dry.     Capillary Refill: Capillary refill takes less than 2 seconds.  Neurological:     Mental Status: She is alert and oriented to person, place, and time.  Psychiatric:        Mood and Affect: Mood normal.        Behavior: Behavior normal.       Assessment & Plan:   GERD - existing problem. Denies current issues. No medications needed at this time. Continue to monitor.   Depression/Anxiety - existing problem. Reports mood is stable on escitalopram 10mg . Patient would like to discontinue escitalopram. Will decrease dose to 5 mg daily with plan to discontinue if she tolerates de-escalation well. She will update office in 1 month.   Hyperlipidemia - existing problem. Last lipid panel normal. Lipid panel and CMP today. Continue atorvastatin. Lab Results  Component Value Date   CHOL 169 08/15/2022   HDL 59.10 08/15/2022   LDLCALC 91 08/15/2022   LDLDIRECT 142.9 04/04/2007   TRIG 94.0 08/15/2022   CHOLHDL 3 08/15/2022    Hypercalcemia - existing problem. Serum calcium in 08/2022 was 10.6. Calcium 9.9 in 03/2023 and PTH 47. CMP today. No symptoms of hypercalcemia.   Osteopenia - existing problem. Update bone density.   Cristopher Peru, RN

## 2023-09-07 NOTE — Assessment & Plan Note (Signed)
Will update bone density.  ?

## 2023-09-07 NOTE — Patient Instructions (Signed)
 Please decrease your lexapro from 10mg  to 5mg  daily. Let me know how you are feeling in 1 month.

## 2023-09-07 NOTE — Assessment & Plan Note (Signed)
Stable without medication. Monitor.  

## 2023-09-07 NOTE — Assessment & Plan Note (Addendum)
 Currently on lexapro 10mg . Wishes to come off of lexapro. Recommended that she cut in half and take 1/2 tab once daily for 1 month and then send me an update on her progress.

## 2023-09-07 NOTE — Progress Notes (Signed)
 Subjective:     Patient ID: Samantha Landry, female    DOB: [birth year], 78 y.o.   MRN: [id number]  Chief Complaint  Patient presents with   Hyperlipidemia    Here for follow up    Depression    Here for follow up, "doing well"   Anxiety    Here for follow up    HPI  Discussed the use of AI scribe software for clinical note transcription with the patient, who gave verbal consent to proceed.  History of Present Illness  Patient is a 78 yr old female who presents today for follow up.  Mood- mood is stable.  Her husband unfortunately passed recently.  She is feeling less stressed now that she is not a full time caregiver. She has been more active in the community which she enjoys. She is interested in coming off of lexapro.  Hyperlipidemia- continues atorvastatin.   GERD- stable without medication.   Health Maintenance Due  Topic Date Due   DTaP/Tdap/Td (2 - Td or Tdap) 10/25/2020    Past Medical History:  Diagnosis Date   Allergy    Alopecia 08/03/2011   Anxiety    Anxiety and depression 11/25/2015   Arthritis    Benign neoplasm of skin of trunk 11/14/2010   Cancer (HCC)    Cataract    Colon polyp    Diverticulosis of colon    DIVERTICULOSIS, COLON 03/26/2007   Qualifier: Diagnosis of  By: Nena Jordan    GERD (gastroesophageal reflux disease)    Heart murmur    HOT FLASHES 03/25/2007   Qualifier: Diagnosis of  By: Nena Jordan    Hyperlipidemia    Melanoma in situ [facility]) 02/2007   right arm   Osteopenia 08/20/2012   PULMONARY NODULE 03/25/2007   Qualifier: Diagnosis of  By: Nena Jordan    Pulmonary nodules 02/2007   2-79mm   SKIN CANCER, HX OF 03/26/2007   Qualifier: Diagnosis of  By: Nena Jordan     Past Surgical History:  Procedure Laterality Date   bone spur Left 11/07/2017   thumb   BUNIONECTOMY Left    COLONOSCOPY     DILATION AND CURETTAGE OF UTERUS  2002   uterine fibroids   EYE SURGERY Bilateral 2019   LEFT HEART CATH  AND CORONARY ANGIOGRAPHY N/A 06/25/2017   Procedure: LEFT HEART CATH AND CORONARY ANGIOGRAPHY;  Surgeon: Marykay Lex, MD;  Location: [facility] INVASIVE CV LAB;  Service: Cardiovascular;  Laterality: N/A;   TONSILLECTOMY      Family History  Problem Relation Age of Onset   Lung cancer Father    Colon cancer Cousin        1st pat.cousin   Rectal cancer Neg Hx    Stomach cancer Neg Hx    Esophageal cancer Neg Hx    Pancreatic cancer Neg Hx     Social History   Socioeconomic History   Marital status: Widowed    Spouse name: Not on file   Number of children: 2   Years of education: Not on file   Highest education level: 12th grade  Occupational History   Occupation: retired    Comment: Public librarian  Tobacco Use   Smoking status: Never   Smokeless tobacco: Never  Substance and Sexual Activity   Alcohol use: Yes    Comment: 4-5 glasses of wine per week   Drug use: No   Sexual activity: Not Currently  Other Topics Concern   Not on file  Social History Narrative   Retired housewife, but worked previously as Public librarian. Daughter is PA @ orthopedic office. Son is a Runner, broadcasting/film/video   Widowed, husband died of Dementia complications- she was primary caregiver   Social Drivers of Corporate investment banker Strain: Low Risk  (09/02/2023)   Overall Financial Resource Strain (CARDIA)    Difficulty of Paying Living Expenses: Not hard at all  Food Insecurity: No Food Insecurity (09/02/2023)   Hunger Vital Sign    Worried About Running Out of Food in the Last Year: Never true    Ran Out of Food in the Last Year: Never true  Transportation Needs: No Transportation Needs (09/02/2023)   PRAPARE - Administrator, Civil Service (Medical): No    Lack of Transportation (Non-Medical): No  Physical Activity: Sufficiently Active (09/02/2023)   Exercise Vital Sign    Days of Exercise per Week: 7 days    Minutes of Exercise per Session: 60 min  Stress: Stress Concern Present  (09/02/2023)   [employer] of Occupational Health - Occupational Stress Questionnaire    Feeling of Stress : To some extent  Social Connections: Moderately Integrated (09/02/2023)   Social Connection and Isolation Panel [NHANES]    Frequency of Communication with Friends and Family: More than three times a week    Frequency of Social Gatherings with Friends and Family: Twice a week    Attends Religious Services: More than 4 times per year    Active Member of [employer] or Organizations: Yes    Attends Banker Meetings: More than 4 times per year    Marital Status: Widowed  Intimate Partner Violence: Not At Risk (02/16/2023)   Humiliation, Afraid, Rape, and Kick questionnaire    Fear of Current or Ex-Partner: No    Emotionally Abused: No    Physically Abused: No    Sexually Abused: No    Outpatient Medications Prior to Visit  Medication Sig Dispense Refill   acetaminophen (TYLENOL) 325 MG tablet Take 650 mg by mouth every 6 (six) hours as needed for mild pain or moderate pain.     aspirin 81 MG tablet Take 81 mg by mouth daily.     fish oil-omega-3 fatty acids 1000 MG capsule Take 1 g by mouth daily.     atorvastatin (LIPITOR) 20 MG tablet Take 1 tablet (20 mg total) by mouth daily. 90 tablet 1   escitalopram (LEXAPRO) 10 MG tablet Take 1 tablet (10 mg total) by mouth daily. 90 tablet 1   Calcium Carb-Cholecalciferol (CALCIUM 500 + D) 500-5 MG-MCG TABS Take 1 tablet by mouth daily at 6 (six) AM.     No facility-administered medications prior to visit.    Allergies  Allergen Reactions   Codeine Nausea And Vomiting    REACTION: Jittery    ROS See HPI    Objective:    Physical Exam Constitutional:      General: She is not in acute distress.    Appearance: Normal appearance. She is well-developed.  HENT:     Head: Normocephalic and atraumatic.     Right Ear: External ear normal.     Left Ear: External ear normal.  Eyes:     General: No scleral icterus. Neck:      Thyroid: No thyromegaly.  Cardiovascular:     Rate and Rhythm: Normal rate and regular rhythm.     Heart sounds: Normal heart sounds. No murmur  heard. Pulmonary:     Effort: Pulmonary effort is normal. No respiratory distress.     Breath sounds: Normal breath sounds. No wheezing.  Musculoskeletal:     Cervical back: Neck supple.  Skin:    General: Skin is warm and dry.  Neurological:     Mental Status: She is alert and oriented to person, place, and time.  Psychiatric:        Mood and Affect: Mood normal.        Behavior: Behavior normal.        Thought Content: Thought content normal.        Judgment: Judgment normal.      BP (!) 117/52 (BP Location: Right Arm, Patient Position: Sitting, Cuff Size: Normal)   Pulse 64   Temp 98.4 F (36.9 C) (Oral)   Resp 16   Ht 5\' 3"  (1.6 m)   Wt 139 lb (63 kg)   SpO2 98%   BMI 24.62 kg/m  Wt Readings from Last 3 Encounters:  09/07/23 139 lb (63 kg)  02/20/23 139 lb (63 kg)  08/15/22 146 lb (66.2 kg)       Assessment & Plan:   Problem List Items Addressed This Visit       Unprioritized   Osteopenia   Will update bone density.       Relevant Orders   DG Bone Density   Hyperlipidemia   Lab Results  Component Value Date   CHOL 169 08/15/2022   HDL 59.10 08/15/2022   LDLCALC 91 08/15/2022   LDLDIRECT 142.9 04/04/2007   TRIG 94.0 08/15/2022   CHOLHDL 3 08/15/2022   Maintained on atorvastatin.       Relevant Medications   atorvastatin (LIPITOR) 20 MG tablet   Other Relevant Orders   Lipid panel   Comp Met (CMET)   GERD (gastroesophageal reflux disease)   Stable without medication. Monitor.       Anxiety and depression - Primary   Currently on lexapro 10mg . Wishes to come off of lexapro. Recommended that she cut in half and take 1/2 tab once daily for 1 month and then send me an update on her progress.       Relevant Medications   escitalopram (LEXAPRO) 10 MG tablet    I have discontinued [place].  Manthe's Calcium Carb-Cholecalciferol and escitalopram. I have also changed her escitalopram. Additionally, I am having her maintain her aspirin, fish oil-omega-3 fatty acids, acetaminophen, and atorvastatin.  Meds ordered this encounter  Medications   DISCONTD: escitalopram (LEXAPRO) 10 MG tablet    Sig: Take 1 tablet (10 mg total) by mouth daily.    Dispense:  90 tablet    Refill:  1    Supervising Provider:   Danise Edge A [[id number]]   atorvastatin (LIPITOR) 20 MG tablet    Sig: Take 1 tablet (20 mg total) by mouth daily.    Dispense:  90 tablet    Refill:  1    Supervising Provider:   Danise Edge A [[id number]]   escitalopram (LEXAPRO) 10 MG tablet    Sig: Take 0.5 tablets (5 mg total) by mouth daily.    Dispense:  45 tablet    Refill:  0    Supervising Provider:   Danise Edge A [[id number]]

## 2023-09-07 NOTE — Assessment & Plan Note (Addendum)
 Lab Results  Component Value Date   CHOL 169 08/15/2022   HDL 59.10 08/15/2022   LDLCALC 91 08/15/2022   LDLDIRECT 142.9 04/04/2007   TRIG 94.0 08/15/2022   CHOLHDL 3 08/15/2022   Maintained on atorvastatin.

## 2023-09-09 ENCOUNTER — Encounter: Payer: Self-pay | Admitting: Family

## 2023-09-11 ENCOUNTER — Other Ambulatory Visit: Payer: Self-pay | Admitting: Family

## 2023-09-11 DIAGNOSIS — F32A Anxiety disorder, unspecified: Secondary | ICD-10-CM

## 2023-09-17 DIAGNOSIS — M542 Cervicalgia: Secondary | ICD-10-CM | POA: Diagnosis not present

## 2023-09-26 DIAGNOSIS — M542 Cervicalgia: Secondary | ICD-10-CM | POA: Diagnosis not present

## 2023-09-28 DIAGNOSIS — M542 Cervicalgia: Secondary | ICD-10-CM | POA: Diagnosis not present

## 2023-10-02 DIAGNOSIS — M542 Cervicalgia: Secondary | ICD-10-CM | POA: Diagnosis not present

## 2023-10-09 DIAGNOSIS — M542 Cervicalgia: Secondary | ICD-10-CM | POA: Diagnosis not present

## 2023-10-09 DIAGNOSIS — M25551 Pain in right hip: Secondary | ICD-10-CM | POA: Diagnosis not present

## 2023-10-12 ENCOUNTER — Encounter: Payer: Self-pay | Admitting: Family

## 2023-10-12 DIAGNOSIS — Z780 Asymptomatic menopausal state: Secondary | ICD-10-CM

## 2023-10-12 DIAGNOSIS — Z1231 Encounter for screening mammogram for malignant neoplasm of breast: Secondary | ICD-10-CM

## 2023-10-17 DIAGNOSIS — M542 Cervicalgia: Secondary | ICD-10-CM | POA: Diagnosis not present

## 2023-11-27 DIAGNOSIS — Z1231 Encounter for screening mammogram for malignant neoplasm of breast: Secondary | ICD-10-CM | POA: Diagnosis not present

## 2023-11-27 LAB — HM MAMMOGRAPHY

## 2023-11-28 ENCOUNTER — Encounter: Payer: Self-pay | Admitting: Family

## 2023-12-12 ENCOUNTER — Ambulatory Visit: Admitting: Family

## 2023-12-21 ENCOUNTER — Encounter: Payer: Self-pay | Admitting: Family

## 2023-12-21 ENCOUNTER — Ambulatory Visit (INDEPENDENT_AMBULATORY_CARE_PROVIDER_SITE_OTHER): Admitting: Family

## 2023-12-21 VITALS — BP 105/48 | HR 66 | Temp 97.6°F | Resp 15 | Ht 63.0 in | Wt 139.4 lb

## 2023-12-21 DIAGNOSIS — F32A Depression, unspecified: Secondary | ICD-10-CM | POA: Diagnosis not present

## 2023-12-21 DIAGNOSIS — E782 Mixed hyperlipidemia: Secondary | ICD-10-CM

## 2023-12-21 DIAGNOSIS — F419 Anxiety disorder, unspecified: Secondary | ICD-10-CM | POA: Diagnosis not present

## 2023-12-21 NOTE — Progress Notes (Signed)
 Subjective:     Patient ID: Samantha Landry, female    DOB: 09/07/1945, 78 y.o.   MRN: [id number]  Chief Complaint  Patient presents with   Follow-up    Anxiety and depression    HPI  Discussed the use of AI scribe software for clinical note transcription with the patient, who gave verbal consent to proceed.  History of Present Illness   Samantha Landry is a 78 year old female who presents for follow-up after decreasing dose of lexapro  to 5mg  last April.   Her caregiving responsibilities, which initially led to the prescription, have been alleviated following the passing of her husband one year ago. She experiences significant family stressors, including her daughter's health issues with PE and the recent loss of her sister on month ago due to NASH Cirrhosis. Despite these challenges, she feels she is coping well emotionally.  She continues atorvastatin .     Health Maintenance Due  Topic Date Due   DTaP/Tdap/Td (2 - Td or Tdap) 10/25/2020   Medicare Annual Wellness (AWV)  02/16/2024    Past Medical History:  Diagnosis Date   Allergy    Alopecia 08/03/2011   Anxiety    Anxiety and depression 11/25/2015   Arthritis    Benign neoplasm of skin of trunk 11/14/2010   Cancer (HCC)    Cataract    Colon polyp    Diverticulosis of colon    DIVERTICULOSIS, COLON 03/26/2007   Qualifier: Diagnosis of  By: Georgian ROSALEA CHARM Lamar    GERD (gastroesophageal reflux disease)    Heart murmur    HOT FLASHES 03/25/2007   Qualifier: Diagnosis of  By: Georgian ROSALEA CHARM Lamar    Hyperlipidemia    Melanoma in situ [facility]) 02/2007   right arm   Osteopenia 08/20/2012   PULMONARY NODULE 03/25/2007   Qualifier: Diagnosis of  By: Georgian ROSALEA CHARM Lamar    Pulmonary nodules 02/2007   2-55mm   SKIN CANCER, HX OF 03/26/2007   Qualifier: Diagnosis of  By: Georgian ROSALEA CHARM Lamar     Past Surgical History:  Procedure Laterality Date   bone spur Left 11/07/2017   thumb   BUNIONECTOMY Left    COLONOSCOPY      DILATION AND CURETTAGE OF UTERUS  2002   uterine fibroids   EYE SURGERY Bilateral 2019   LEFT HEART CATH AND CORONARY ANGIOGRAPHY N/A 06/25/2017   Procedure: LEFT HEART CATH AND CORONARY ANGIOGRAPHY;  Surgeon: Anner Alm ORN, MD;  Location: [facility] INVASIVE CV LAB;  Service: Cardiovascular;  Laterality: N/A;   TONSILLECTOMY      Family History  Problem Relation Age of Onset   Lung cancer Father    Liver disease Sister        NASH   Colon cancer Cousin        1st pat.cousin   Pulmonary embolism Daughter    Migraines Daughter    Rectal cancer Neg Hx    Stomach cancer Neg Hx    Esophageal cancer Neg Hx    Pancreatic cancer Neg Hx     Social History   Socioeconomic History   Marital status: Widowed    Spouse name: Not on file   Number of children: 2   Years of education: Not on file   Highest education level: 12th grade  Occupational History   Occupation: retired    Comment: Public librarian  Tobacco Use   Smoking status: Never   Smokeless tobacco: Never  Substance and Sexual  Activity   Alcohol use: Yes    Comment: 4-5 glasses of wine per week   Drug use: No   Sexual activity: Not Currently  Other Topics Concern   Not on file  Social History Narrative   Retired housewife, but worked previously as Public librarian. Daughter is PA @ orthopedic office. Son is a Runner, broadcasting/film/video   Widowed, husband died of Dementia complications- she was primary caregiver   Social Drivers of Corporate investment banker Strain: Low Risk  (12/14/2023)   Overall Financial Resource Strain (CARDIA)    Difficulty of Paying Living Expenses: Not hard at all  Food Insecurity: No Food Insecurity (12/14/2023)   Hunger Vital Sign    Worried About Running Out of Food in the Last Year: Never true    Ran Out of Food in the Last Year: Never true  Transportation Needs: No Transportation Needs (12/14/2023)   PRAPARE - Administrator, Civil Service (Medical): No    Lack of Transportation (Non-Medical):  No  Physical Activity: Sufficiently Active (12/14/2023)   Exercise Vital Sign    Days of Exercise per Week: 7 days    Minutes of Exercise per Session: 60 min  Stress: No Stress Concern Present (12/14/2023)   [employer] of Occupational Health - Occupational Stress Questionnaire    Feeling of Stress: Only a little  Social Connections: Moderately Integrated (12/14/2023)   Social Connection and Isolation Panel    Frequency of Communication with Friends and Family: More than three times a week    Frequency of Social Gatherings with Friends and Family: Once a week    Attends Religious Services: More than 4 times per year    Active Member of [employer] or Organizations: Yes    Attends Banker Meetings: More than 4 times per year    Marital Status: Widowed  Intimate Partner Violence: Not At Risk (02/16/2023)   Humiliation, Afraid, Rape, and Kick questionnaire    Fear of Current or Ex-Partner: No    Emotionally Abused: No    Physically Abused: No    Sexually Abused: No    Outpatient Medications Prior to Visit  Medication Sig Dispense Refill   acetaminophen  (TYLENOL ) 325 MG tablet Take 650 mg by mouth every 6 (six) hours as needed for mild pain or moderate pain.     aspirin  81 MG tablet Take 81 mg by mouth daily.     atorvastatin  (LIPITOR) 20 MG tablet Take 1 tablet (20 mg total) by mouth daily. 90 tablet 1   fish oil-omega-3 fatty acids 1000 MG capsule Take 1 g by mouth daily.     escitalopram  (LEXAPRO ) 10 MG tablet Take 0.5 tablets (5 mg total) by mouth daily. 45 tablet 0   No facility-administered medications prior to visit.    Allergies  Allergen Reactions   Codeine Nausea And Vomiting    REACTION: Jittery    ROS    See HPI Objective:    Physical Exam Constitutional:      General: She is not in acute distress.    Appearance: Normal appearance. She is well-developed.  HENT:     Head: Normocephalic and atraumatic.     Right Ear: External ear normal.     Left  Ear: External ear normal.  Eyes:     General: No scleral icterus. Neck:     Thyroid : No thyromegaly.  Cardiovascular:     Rate and Rhythm: Normal rate and regular rhythm.     Heart sounds: Normal  heart sounds. No murmur heard. Pulmonary:     Effort: Pulmonary effort is normal. No respiratory distress.     Breath sounds: Normal breath sounds. No wheezing.  Musculoskeletal:     Cervical back: Neck supple.  Skin:    General: Skin is warm and dry.  Neurological:     Mental Status: She is alert and oriented to person, place, and time.  Psychiatric:        Mood and Affect: Mood normal.        Behavior: Behavior normal.        Thought Content: Thought content normal.        Judgment: Judgment normal.      BP (!) 105/48   Pulse 66   Temp 97.6 F (36.4 C) (Oral)   Resp 15   Ht 5' 3 (1.6 m)   Wt 139 lb 6.4 oz (63.2 kg)   SpO2 100%   BMI 24.69 kg/m  Wt Readings from Last 3 Encounters:  12/21/23 139 lb 6.4 oz (63.2 kg)  09/07/23 139 lb (63 kg)  02/20/23 139 lb (63 kg)       Assessment & Plan:   Problem List Items Addressed This Visit       Unprioritized   Hyperlipidemia    She is on Lipitor for hyperlipidemia. Lipids stable on atorvastatin .  Continue same.  Lab Results  Component Value Date   CHOL 138 09/07/2023   HDL 50.90 09/07/2023   LDLCALC 62 09/07/2023   LDLDIRECT 142.9 04/04/2007   TRIG 127.0 09/07/2023   CHOLHDL 3 09/07/2023         Anxiety and depression - Primary    She has been tapering off Lexapro  and doing well on the 1/2 tab 5mg .  She reports feeling well despite recent family stressors. - Recommend that she continue the taper by taking half a dose every other day for 1-2 weeks. - Send message in a few weeks to update on progress.        I have discontinued Zaliyah K. Thole's escitalopram . I am also having her maintain her aspirin , fish oil-omega-3 fatty acids, acetaminophen , and atorvastatin .  No orders of the defined types were placed in  this encounter.

## 2023-12-21 NOTE — Patient Instructions (Addendum)
 VISIT SUMMARY:  You had a follow-up visit to discuss your progress after tapering off Lexapro . You have successfully decreased tour lexapro  dose and are coping well despite recent family stressors. We also reviewed your current medication for hyperlipidemia.  YOUR PLAN:  DEPRESSION: You have been tapering off Lexapro  and are feeling well despite recent family stressors. -Continue your lexapro  taper with 1/2 tab every other day for 1-2 weeks then stop. -Send us  a message in a few weeks to update us  on your progress.  HYPERLIPIDEMIA: You are currently taking Lipitor for hyperlipidemia. -Ensure your Lipitor prescription is current.

## 2023-12-21 NOTE — Assessment & Plan Note (Signed)
  She has been tapering off Lexapro  and doing well on the 1/2 tab 5mg .  She reports feeling well despite recent family stressors. - Recommend that she continue the taper by taking half a dose every other day for 1-2 weeks. - Send message in a few weeks to update on progress.

## 2023-12-21 NOTE — Assessment & Plan Note (Signed)
  She is on Lipitor for hyperlipidemia. Lipids stable on atorvastatin .  Continue same.  Lab Results  Component Value Date   CHOL 138 09/07/2023   HDL 50.90 09/07/2023   LDLCALC 62 09/07/2023   LDLDIRECT 142.9 04/04/2007   TRIG 127.0 09/07/2023   CHOLHDL 3 09/07/2023

## 2024-02-05 DIAGNOSIS — Z85820 Personal history of malignant melanoma of skin: Secondary | ICD-10-CM | POA: Diagnosis not present

## 2024-02-05 DIAGNOSIS — L814 Other melanin hyperpigmentation: Secondary | ICD-10-CM | POA: Diagnosis not present

## 2024-02-05 DIAGNOSIS — L821 Other seborrheic keratosis: Secondary | ICD-10-CM | POA: Diagnosis not present

## 2024-02-05 DIAGNOSIS — D229 Melanocytic nevi, unspecified: Secondary | ICD-10-CM | POA: Diagnosis not present

## 2024-02-07 ENCOUNTER — Encounter: Payer: Self-pay | Admitting: Family

## 2024-02-19 ENCOUNTER — Ambulatory Visit (INDEPENDENT_AMBULATORY_CARE_PROVIDER_SITE_OTHER)

## 2024-02-19 VITALS — Ht 63.0 in | Wt 139.0 lb

## 2024-02-19 DIAGNOSIS — Z0000 Encounter for general adult medical examination without abnormal findings: Secondary | ICD-10-CM | POA: Diagnosis not present

## 2024-02-19 NOTE — Patient Instructions (Signed)
 [name],  Thank you for taking the time for your Medicare Wellness Visit. I appreciate your continued commitment to your health goals. Please review the care plan we discussed, and feel free to reach out if I can assist you further.  Medicare recommends these wellness visits once per year to help you and your care team stay ahead of potential health issues. These visits are designed to focus on prevention, allowing your provider to concentrate on managing your acute and chronic conditions during your regular appointments.  Please note that Annual Wellness Visits do not include a physical exam. Some assessments may be limited, especially if the visit was conducted virtually. If needed, we may recommend a separate in-person follow-up with your provider.  Ongoing Care Seeing your primary care provider every 3 to 6 months helps us  monitor your health and provide consistent, personalized care.   Referrals If a referral was made during today's visit and you haven't received any updates within two weeks, please contact the referred provider directly to check on the status.  Recommended Screenings:  Health Maintenance  Topic Date Due   DTaP/Tdap/Td vaccine (2 - Td or Tdap) 10/25/2020   Flu Shot  01/04/2024   Medicare Annual Wellness Visit  02/18/2025   Pneumococcal Vaccine for age over 41  Completed   DEXA scan (bone density measurement)  Completed   Hepatitis C Screening  Completed   Zoster (Shingles) Vaccine  Completed   HPV Vaccine  Aged Out   Meningitis B Vaccine  Aged Out   Breast Cancer Screening  Discontinued   COVID-19 Vaccine  Discontinued       02/19/2024    9:28 AM  Advanced Directives  Does Patient Have a Medical Advance Directive? No  Would patient like information on creating a medical advance directive? Yes (MAU/Ambulatory/Procedural Areas - Information given)   Advance Care Planning is important because it: Ensures you receive medical care that aligns with your values,  goals, and preferences. Provides guidance to your family and loved ones, reducing the emotional burden of decision-making during critical moments.  Information on Advanced Care Planning can be found at [place]  Secretary of [facility] Advance Health Care Directives Advance Health Care Directives ([email or web address]   Vision: Annual vision screenings are recommended for early detection of glaucoma, cataracts, and diabetic retinopathy. These exams can also reveal signs of chronic conditions such as diabetes and high blood pressure.  Dental: Annual dental screenings help detect early signs of oral cancer, gum disease, and other conditions linked to overall health, including heart disease and diabetes.  Please see the attached documents for additional preventive care recommendations.

## 2024-02-19 NOTE — Progress Notes (Signed)
 Subjective:   Samantha Landry is a 78 y.o. who presents for a Medicare Wellness preventive visit.  As a reminder, Annual Wellness Visits don't include a physical exam, and some assessments may be limited, especially if this visit is performed virtually. We may recommend an in-person follow-up visit with your provider if needed.  Visit Complete: Virtual I connected with  Aracelis K Alvarez on 02/19/24 by a audio enabled telemedicine application and verified that I am speaking with the correct person using two identifiers.  Patient Location: Home  Provider Location: Home Office  I discussed the limitations of evaluation and management by telemedicine. The patient expressed understanding and agreed to proceed.  Vital Signs: Because this visit was a virtual/telehealth visit, some criteria may be missing or patient reported. Any vitals not documented were not able to be obtained and vitals that have been documented are patient reported.  VideoDeclined- This patient declined Librarian, academic. Therefore the visit was completed with audio only.  Persons Participating in Visit: Patient.  AWV Questionnaire: Yes: Patient Medicare AWV questionnaire was completed by the patient on 02/18/24; I have confirmed that all information answered by patient is correct and no changes since this date.  Cardiac Risk Factors include: advanced age (>64men, >33 women);dyslipidemia     Objective:    Today's Vitals   02/19/24 0929  Weight: 139 lb (63 kg)  Height: 5' 3 (1.6 m)   Body mass index is 24.62 kg/m.     02/19/2024    9:28 AM 02/16/2023    9:04 AM 02/10/2022    9:00 AM 12/09/2020    9:35 AM 12/03/2019    8:51 AM 11/29/2018    2:41 PM 11/26/2017    3:01 PM  Advanced Directives  Does Patient Have a Medical Advance Directive? No Yes Yes Yes Yes Yes Yes   Type of Furniture conservator/restorer;Living will Healthcare Power of [place];Living will Healthcare Power of  [place];Living will Healthcare Power of [place];Living will Healthcare Power of [place];Living will Healthcare Power of [place];Living will  Does patient want to make changes to medical advance directive?  No - Patient declined   No - Patient declined No - Patient declined    Copy of Healthcare Power of Attorney in Chart?  No - copy requested No - copy requested No - copy requested No - copy requested No - copy requested  No - copy requested   Would patient like information on creating a medical advance directive? Yes (MAU/Ambulatory/Procedural Areas - Information given)           Data saved with a previous flowsheet row definition    Current Medications (verified) Outpatient Encounter Medications as of 02/19/2024  Medication Sig   acetaminophen  (TYLENOL ) 325 MG tablet Take 650 mg by mouth every 6 (six) hours as needed for mild pain or moderate pain.   aspirin  81 MG tablet Take 81 mg by mouth daily.   atorvastatin  (LIPITOR) 20 MG tablet Take 1 tablet (20 mg total) by mouth daily.   fish oil-omega-3 fatty acids 1000 MG capsule Take 1 g by mouth daily.   No facility-administered encounter medications on file as of 02/19/2024.    Allergies (verified) Codeine   History: Past Medical History:  Diagnosis Date   Allergy    Alopecia 08/03/2011   Anxiety    Anxiety and depression 11/25/2015   Arthritis    Benign neoplasm of skin of trunk 11/14/2010   Cancer (HCC)  Cataract    Colon polyp    Diverticulosis of colon    DIVERTICULOSIS, COLON 03/26/2007   Qualifier: Diagnosis of  By: Georgian ROSALEA CHARM Lamar    GERD (gastroesophageal reflux disease)    Heart murmur    HOT FLASHES 03/25/2007   Qualifier: Diagnosis of  By: Georgian ROSALEA CHARM Lamar    Hyperlipidemia    Melanoma in situ [facility]) 02/2007   right arm   Osteopenia 08/20/2012   PULMONARY NODULE 03/25/2007   Qualifier: Diagnosis of  By: Georgian ROSALEA CHARM Lamar    Pulmonary nodules 02/2007   2-66mm   SKIN CANCER, HX OF 03/26/2007   Qualifier:  Diagnosis of  By: Georgian ROSALEA CHARM Lamar    Past Surgical History:  Procedure Laterality Date   bone spur Left 11/07/2017   thumb   BUNIONECTOMY Left    COLONOSCOPY     DILATION AND CURETTAGE OF UTERUS  2002   uterine fibroids   EYE SURGERY Bilateral 2019   LEFT HEART CATH AND CORONARY ANGIOGRAPHY N/A 06/25/2017   Procedure: LEFT HEART CATH AND CORONARY ANGIOGRAPHY;  Surgeon: Anner Alm ORN, MD;  Location: [facility] INVASIVE CV LAB;  Service: Cardiovascular;  Laterality: N/A;   TONSILLECTOMY     Family History  Problem Relation Age of Onset   Lung cancer Father    Liver disease Sister        NASH   Colon cancer Cousin        1st pat.cousin   Pulmonary embolism Daughter    Migraines Daughter    Rectal cancer Neg Hx    Stomach cancer Neg Hx    Esophageal cancer Neg Hx    Pancreatic cancer Neg Hx    Social History   Socioeconomic History   Marital status: Widowed    Spouse name: Not on file   Number of children: 2   Years of education: Not on file   Highest education level: 12th grade  Occupational History   Occupation: retired    Comment: Public librarian  Tobacco Use   Smoking status: Never   Smokeless tobacco: Never  Substance and Sexual Activity   Alcohol use: Yes    Comment: 4-5 glasses of wine per week   Drug use: No   Sexual activity: Not Currently  Other Topics Concern   Not on file  Social History Narrative   Retired housewife, but worked previously as Public librarian. Daughter is PA @ orthopedic office. Son is a Runner, broadcasting/film/video   Widowed, husband died of Dementia complications- she was primary caregiver   Social Drivers of Corporate investment banker Strain: Low Risk  (02/19/2024)   Overall Financial Resource Strain (CARDIA)    Difficulty of Paying Living Expenses: Not hard at all  Food Insecurity: No Food Insecurity (02/19/2024)   Hunger Vital Sign    Worried About Running Out of Food in the Last Year: Never true    Ran Out of Food in the Last Year: Never true   Transportation Needs: No Transportation Needs (02/19/2024)   PRAPARE - Administrator, Civil Service (Medical): No    Lack of Transportation (Non-Medical): No  Physical Activity: Sufficiently Active (02/19/2024)   Exercise Vital Sign    Days of Exercise per Week: 7 days    Minutes of Exercise per Session: 60 min  Stress: No Stress Concern Present (02/19/2024)   [employer] of Occupational Health - Occupational Stress Questionnaire    Feeling of Stress: Only a little  Social Connections: Moderately Integrated (02/19/2024)   Social Connection and Isolation Panel    Frequency of Communication with Friends and Family: More than three times a week    Frequency of Social Gatherings with Friends and Family: Once a week    Attends Religious Services: More than 4 times per year    Active Member of [employer] or Organizations: Yes    Attends Banker Meetings: More than 4 times per year    Marital Status: Widowed    Tobacco Counseling Counseling given: Not Answered    Clinical Intake:  Pre-visit preparation completed: Yes  Pain : No/denies pain     Diabetes: No  No results found for: HGBA1C   How often do you need to have someone help you when you read instructions, pamphlets, or other written materials from your doctor or pharmacy?: 1 - Never  Interpreter Needed?: No  Information entered by :: Charmaine Bloodgood LPN   Activities of Daily Living     02/18/2024   12:35 PM  In your present state of health, do you have any difficulty performing the following activities:  Hearing? 0  Vision? 0  Difficulty concentrating or making decisions? 0  Walking or climbing stairs? 0  Dressing or bathing? 0  Doing errands, shopping? 0  Preparing Food and eating ? N  Using the Toilet? N  In the past six months, have you accidently leaked urine? N  Do you have problems with loss of bowel control? N  Managing your Medications? N  Managing your Finances? N   Housekeeping or managing your Housekeeping? N    Patient Care Team: Daryl Setter, NP as PCP - General (Internal Medicine) Debby Inocente BRAVO, MD as Referring Physician (Dermatology)  I have updated your Care Teams any recent Medical Services you may have received from other providers in the past year.     Assessment:   This is a routine wellness examination for Soni.  Hearing/Vision screen Hearing Screening - Comments:: Denies hearing difficulties   Vision Screening - Comments::  up to date with routine eye exams with [facility]    Goals Addressed             This Visit's Progress    Maintain current healthy lifestyle.   On track      Depression Screen     02/19/2024    9:27 AM 12/21/2023   10:01 AM 12/21/2023   10:00 AM 02/16/2023    9:09 AM 08/15/2022   10:09 AM 02/10/2022    9:00 AM 01/31/2022    9:36 AM  PHQ 2/9 Scores  PHQ - 2 Score 1 1 0 0 2 0 1  PHQ- 9 Score 2 2 2  4  3     Fall Risk     02/18/2024   12:35 PM 12/21/2023   10:00 AM 02/16/2023    9:04 AM 02/10/2022    9:00 AM 01/31/2022    9:20 AM  Fall Risk   Falls in the past year? 0 0 0 0 0  Number falls in past yr: 0 0 0 0 0  Injury with Fall? 0 0 0 0 0  Risk for fall due to : No Fall Risks No Fall Risks No Fall Risks No Fall Risks   Follow up Falls prevention discussed;Education provided;Falls evaluation completed Falls evaluation completed Falls evaluation completed Falls evaluation completed       Data saved with a previous flowsheet row definition    MEDICARE RISK AT HOME:  Medicare Risk at Home Any stairs in or around the home?: (Patient-Rptd) Yes If so, are there any without handrails?: (Patient-Rptd) No Home free of loose throw rugs in walkways, pet beds, electrical cords, etc?: (Patient-Rptd) No Adequate lighting in your home to reduce risk of falls?: (Patient-Rptd) Yes Life alert?: (Patient-Rptd) No Use of a cane, walker or w/c?: (Patient-Rptd) No Grab bars in the bathroom?:  (Patient-Rptd) Yes Shower chair or bench in shower?: (Patient-Rptd) Yes Elevated toilet seat or a handicapped toilet?: (Patient-Rptd) Yes  TIMED UP AND GO:  Was the test performed?  No  Cognitive Function: 6CIT completed        02/19/2024    9:28 AM 02/16/2023    9:11 AM 02/10/2022    9:06 AM  6CIT Screen  What Year? 0 points 0 points 0 points  What month? 0 points 0 points 0 points  What time? 0 points 0 points 0 points  Count back from 20 0 points 0 points 2 points  Months in reverse 0 points 0 points 0 points  Repeat phrase 0 points 0 points 2 points  Total Score 0 points 0 points 4 points    Immunizations Immunization History  Administered Date(s) Administered   Fluad Trivalent(High Dose 65+) 02/20/2023   INFLUENZA, HIGH DOSE SEASONAL PF 02/23/2015, 02/25/2016, 02/14/2017   Influenza Split 03/05/2012   Influenza Whole 03/22/2010   Influenza-Unspecified 02/12/2017, 03/07/2018   Moderna Sars-Covid-2 Vaccination 07/17/2019, 08/25/2019, 04/19/2020   Pneumococcal Conjugate-13 09/22/2013   Pneumococcal Polysaccharide-23 03/05/2012   Tdap 10/26/2010   Zoster Recombinant(Shingrix) 05/02/2017, 07/02/2017   Zoster, Live 10/26/2010    Screening Tests Health Maintenance  Topic Date Due   DTaP/Tdap/Td (2 - Td or Tdap) 10/25/2020   Influenza Vaccine  01/04/2024   Medicare Annual Wellness (AWV)  02/18/2025   Pneumococcal Vaccine: 50+ Years  Completed   DEXA SCAN  Completed   Hepatitis C Screening  Completed   Zoster Vaccines- Shingrix  Completed   HPV VACCINES  Aged Out   Meningococcal B Vaccine  Aged Out   Mammogram  Discontinued   COVID-19 Vaccine  Discontinued    Health Maintenance Items Addressed: Information provided on vaccine recommendations   Additional Screening:  Vision Screening: Recommended annual ophthalmology exams for early detection of glaucoma and other disorders of the eye. Is the patient up to date with their annual eye exam?  Yes  Who is the  provider or what is the name of the office in which the patient attends annual eye exams? [facility] provider   Dental Screening: Recommended annual dental exams for proper oral hygiene  Community Resource Referral / Chronic Care Management: CRR required this visit?  No   CCM required this visit?  No   Plan:    I have personally reviewed and noted the following in the patient's chart:   Medical and social history Use of alcohol, tobacco or illicit drugs  Current medications and supplements including opioid prescriptions. Patient is not currently taking opioid prescriptions. Functional ability and status Nutritional status Physical activity Advanced directives List of other physicians Hospitalizations, surgeries, and ER visits in previous 12 months Vitals Screenings to include cognitive, depression, and falls Referrals and appointments  In addition, I have reviewed and discussed with patient certain preventive protocols, quality metrics, and best practice recommendations. A written personalized care plan for preventive services as well as general preventive health recommendations were provided to patient.   Lavelle Pfeiffer [place], [place]   [id number]   After Visit Summary: (MyChart)  Due to this being a telephonic visit, the after visit summary with patients personalized plan was offered to patient via MyChart   Notes: Nothing significant to report at this time.

## 2024-03-05 ENCOUNTER — Other Ambulatory Visit: Payer: Self-pay | Admitting: Family

## 2024-03-05 DIAGNOSIS — E782 Mixed hyperlipidemia: Secondary | ICD-10-CM

## 2024-04-11 DIAGNOSIS — Z9889 Other specified postprocedural states: Secondary | ICD-10-CM | POA: Diagnosis not present

## 2024-04-11 DIAGNOSIS — Z961 Presence of intraocular lens: Secondary | ICD-10-CM | POA: Diagnosis not present

## 2024-04-13 ENCOUNTER — Encounter (INDEPENDENT_AMBULATORY_CARE_PROVIDER_SITE_OTHER): Payer: Self-pay | Admitting: Family

## 2024-04-13 DIAGNOSIS — F32A Depression, unspecified: Secondary | ICD-10-CM

## 2024-04-14 DIAGNOSIS — F32A Depression, unspecified: Secondary | ICD-10-CM

## 2024-04-14 DIAGNOSIS — F419 Anxiety disorder, unspecified: Secondary | ICD-10-CM

## 2024-04-14 NOTE — Telephone Encounter (Signed)

## 2024-05-06 ENCOUNTER — Encounter: Payer: Self-pay | Admitting: Family

## 2024-05-06 DIAGNOSIS — F419 Anxiety disorder, unspecified: Secondary | ICD-10-CM

## 2024-05-06 MED ORDER — ESCITALOPRAM OXALATE 10 MG PO TABS: 10.0000 mg | ORAL_TABLET | Freq: Every day | ORAL | 0 refills | Status: AC

## 2024-06-25 ENCOUNTER — Ambulatory Visit: Admitting: Family

## 2024-08-05 ENCOUNTER — Ambulatory Visit: Admitting: Family

## 2025-03-03 ENCOUNTER — Ambulatory Visit
